# Patient Record
Sex: Female | Born: 1965
Health system: Southern US, Community
[De-identification: ages and names within clinical notes are randomized; demographics above are authoritative.]

## PROBLEM LIST (undated history)

## (undated) DIAGNOSIS — R011 Cardiac murmur, unspecified: Secondary | ICD-10-CM

## (undated) DIAGNOSIS — M1990 Unspecified osteoarthritis, unspecified site: Secondary | ICD-10-CM

## (undated) DIAGNOSIS — F419 Anxiety disorder, unspecified: Secondary | ICD-10-CM

## (undated) DIAGNOSIS — G2581 Restless legs syndrome: Secondary | ICD-10-CM

## (undated) DIAGNOSIS — D649 Anemia, unspecified: Secondary | ICD-10-CM

## (undated) DIAGNOSIS — I10 Essential (primary) hypertension: Secondary | ICD-10-CM

## (undated) DIAGNOSIS — J45909 Unspecified asthma, uncomplicated: Secondary | ICD-10-CM

## (undated) HISTORY — DX: Restless legs syndrome: G25.81

## (undated) HISTORY — DX: Anemia, unspecified: D64.9

## (undated) HISTORY — DX: Anxiety disorder, unspecified: F41.9

## (undated) HISTORY — PX: KNEE ARTHROSCOPY W/ ACL RECONSTRUCTION: SHX1858

## (undated) HISTORY — PX: TONSILLECTOMY: SUR1361

## (undated) HISTORY — DX: Unspecified asthma, uncomplicated: J45.909

## (undated) HISTORY — DX: Unspecified osteoarthritis, unspecified site: M19.90

---

## 1997-01-16 HISTORY — PX: LEEP: SHX91

## 2015-12-14 ENCOUNTER — Other Ambulatory Visit: Payer: Self-pay | Admitting: Obstetrics and Gynecology

## 2015-12-14 ENCOUNTER — Encounter (HOSPITAL_COMMUNITY): Payer: Self-pay | Admitting: *Deleted

## 2015-12-14 DIAGNOSIS — Z1231 Encounter for screening mammogram for malignant neoplasm of breast: Secondary | ICD-10-CM

## 2015-12-16 ENCOUNTER — Ambulatory Visit
Admission: RE | Admit: 2015-12-16 | Discharge: 2015-12-16 | Disposition: A | Discharge status: Home / Self Care | Payer: No Typology Code available for payment source | Source: Ambulatory Visit | Attending: Obstetrics and Gynecology | Admitting: Obstetrics and Gynecology

## 2015-12-16 ENCOUNTER — Ambulatory Visit (HOSPITAL_COMMUNITY)
Admission: RE | Admit: 2015-12-16 | Discharge: 2015-12-16 | Disposition: A | Discharge status: Home / Self Care | Payer: Self-pay | Source: Ambulatory Visit | Attending: Obstetrics and Gynecology | Admitting: Obstetrics and Gynecology

## 2015-12-16 ENCOUNTER — Encounter (HOSPITAL_COMMUNITY): Payer: Self-pay | Admitting: *Deleted

## 2015-12-16 VITALS — BP 104/68 | Temp 98.2°F | Ht 64.0 in | Wt 125.4 lb

## 2015-12-16 DIAGNOSIS — R87613 High grade squamous intraepithelial lesion on cytologic smear of cervix (HGSIL): Secondary | ICD-10-CM

## 2015-12-16 DIAGNOSIS — Z1231 Encounter for screening mammogram for malignant neoplasm of breast: Secondary | ICD-10-CM

## 2015-12-16 DIAGNOSIS — Z1239 Encounter for other screening for malignant neoplasm of breast: Secondary | ICD-10-CM

## 2015-12-16 HISTORY — DX: Cardiac murmur, unspecified: R01.1

## 2015-12-16 HISTORY — DX: Essential (primary) hypertension: I10

## 2015-12-16 NOTE — Patient Instructions (Signed)
Explained breast self awareness to [name]. Patient did not need a Pap smear today due to last Pap smear and HPV typing was completed 12/03/2015. Explained the colposcopy to patient the recommended follow up for her abnormal Pap smear. Referred patient to the [facility] for a colposcopy to follow up for abnormal Pap smear. Appointment scheduled for Friday, December 24, 2015 at 0840. [facility]. Appointment scheduled for Thursday, December 16, 2015 at 1120.  Patient aware of appointments and will be there. Let patient know the Breast Center will follow up with her within the next couple weeks with results of mammogram by letter or phone. Lindsay Butler verbalized understanding.  [place], Kathaleen Maserhristine Poll, RN 12:57 PM

## 2015-12-16 NOTE — Progress Notes (Signed)
[facility] due to having an abnormal Pap smear that a colposcopy is recommended for follow up.  Pap Smear: Pap smear not completed today. [facility] and HGSIL with positive HPV. Referred patient to the [facility] for a colposcopy to follow up for abnormal Pap smear. Appointment scheduled for Friday, December 24, 2015 at 0840. Per patient has a history of one other abnormal Pap smear that a LEEP was completed for follow up. Last Pap smear result will be scanned into EPIC prior to follow up appointment.  Physical exam: Breasts Breasts symmetrical. No skin abnormalities bilateral breasts. No nipple retraction bilateral breasts. No nipple discharge bilateral breasts. No lymphadenopathy. No lumps palpated bilateral breasts. No complaints of pain or tenderness on exam. [facility] mamm[facility]. Appointment scheduled for Thursday, December 16, 2015 at 1120.        Pelvic/Bimanual No Pap smear completed today since last Pap smear was 12/03/2015. Pap smear not indicated per BCCCP guidelines.   Smoking History: Patient has never smoked.  Patient Navigation: Patient education provided. Access to services provided for patient through BCCCP program.   Colorectal Cancer Screening: Per patient has never had a colonoscopy completed. No complaints today.

## 2015-12-17 ENCOUNTER — Encounter (HOSPITAL_COMMUNITY): Payer: Self-pay | Admitting: *Deleted

## 2015-12-24 ENCOUNTER — Encounter: Payer: Self-pay | Admitting: Obstetrics & Gynecology

## 2015-12-24 ENCOUNTER — Other Ambulatory Visit (HOSPITAL_COMMUNITY)
Admission: RE | Admit: 2015-12-24 | Discharge: 2015-12-24 | Disposition: A | Discharge status: Home / Self Care | Payer: No Typology Code available for payment source | Source: Ambulatory Visit | Attending: Obstetrics & Gynecology | Admitting: Obstetrics & Gynecology

## 2015-12-24 ENCOUNTER — Ambulatory Visit (INDEPENDENT_AMBULATORY_CARE_PROVIDER_SITE_OTHER): Payer: Self-pay | Admitting: Obstetrics & Gynecology

## 2015-12-24 VITALS — BP 126/78 | HR 66 | Wt 127.8 lb

## 2015-12-24 DIAGNOSIS — R87613 High grade squamous intraepithelial lesion on cytologic smear of cervix (HGSIL): Secondary | ICD-10-CM

## 2015-12-24 DIAGNOSIS — Z3202 Encounter for pregnancy test, result negative: Secondary | ICD-10-CM

## 2015-12-24 LAB — POCT PREGNANCY, URINE: PREG TEST UR: NEGATIVE

## 2015-12-24 NOTE — Patient Instructions (Addendum)
Thank you for enrolling in MyChart. Please follow the instructions below to securely access your online medical record. MyChart allows you to send messages to your doctor, view your test results, manage appointments, and more.   How Do I Sign Up? 1. In your Internet browser, go to [employer] Address [employer] and enter [email or web address]. 2. Click on the Sign Up Now link in the Sign In box. You will see the New Member Sign Up page. 3. Enter your MyChart Access Code exactly as it appears below. You will not need to use this code after you've completed the sign-up process. If you do not sign up before the expiration date, you must request a new code.  MyChart Access Code:  Sent on text  4. Enter your Social Security Number ([id number]) and Date of Birth (mm/dd/yyyy) as indicated and click Submit. You will be taken to the next sign-up page. 5. Create a MyChart ID. This will be your MyChart login ID and cannot be changed, so think of one that is secure and easy to remember. 6. Create a MyChart password. You can change your password at any time. 7. Enter your Password Reset Question and Answer. This can be used at a later time if you forget your password.  8. Enter your e-mail address. You will receive e-mail notification when new information is available in MyChart. 9. Click Sign Up. You can now view your medical record.   Additional Information Remember, MyChart is NOT to be used for urgent needs. For medical emergencies, dial 911.     Colposcopy, Care After This sheet gives you information about how to care for yourself after your procedure. Your health care provider may also give you more specific instructions. If you have problems or questions, contact your health care provider. What can I expect after the procedure? If you had a colposcopy without a biopsy, you can expect to feel fine right away, but you may have some spotting for a few days. You can go back to your normal activities. If  you had a colposcopy with a biopsy, it is common to have:  Soreness and pain. This may last for a few days.  Light-headedness.  Mild vaginal bleeding or dark-colored, grainy discharge. This may last for a few days. The discharge may be due to a solution that was used during the procedure. You may need to wear a sanitary pad during this time.  Spotting for at least 48 hours after the procedure. Follow these instructions at home:  Take over-the-counter and prescription medicines only as told by your health care provider. Talk with your health care provider about what type of over-the-counter pain medicine and prescription medicine you can start taking again. It is especially important to talk with your health care provider if you take blood-thinning medicine.  Do not drive or use heavy machinery while taking prescription pain medicine.  For at least 3 days after your procedure, or as long as told by your health care provider, avoid:  Douching.  Using tampons.  Having sexual intercourse.  Continue to use birth control (contraception).  Limit your physical activity for the first day after the procedure as told by your health care provider. Ask your health care provider what activities are safe for you.  It is up to you to get the results of your procedure. Ask your health care provider, or the department performing the procedure, when your results will be ready.  Keep all follow-up visits as told by your health care provider.  This is important. Contact a health care provider if:  You develop a skin rash. Get help right away if:  You are bleeding heavily from your vagina or you are passing blood clots. This includes using more than one sanitary pad per hour for 2 hours in a row.  You have a fever or chills.  You have pelvic pain.  You have abnormal, yellow-colored, or bad-smelling vaginal discharge. This could be a sign of infection.  You have severe pain or cramps in your lower  abdomen that do not get better with medicine.  You feel light-headed or dizzy, or you faint. Summary  If you had a colposcopy without a biopsy, you can expect to feel fine right away, but you may have some spotting for a few days. You can go back to your normal activities.  If you had a colposcopy with a biopsy, you may notice mild pain and spotting for 48 hours after the procedure.  Avoid douching, using tampons, and having sexual intercourse for 3 days after the procedure or as long as told by your health care provider.  Contact your health care provider if you have bleeding, severe pain, or signs of infection. This information is not intended to replace advice given to you by your health care provider. Make sure you discuss any questions you have with your health care provider. Document Released: 10/23/2012 Document Revised: 08/20/2015 Document Reviewed: 08/20/2015 Elsevier Interactive Patient Education  2017 [employer]

## 2015-12-24 NOTE — Progress Notes (Signed)
    GYNECOLOGY CLINIC COLPOSCOPY PROCEDURE NOTE  50 y.o. G1P0010 here for colposcopy for high-grade squamous intraepithelial neoplasia  (HGSIL-encompassing moderate and severe dysplasia) pap smear on 12/03/15.Per patient has a history of one other abnormal Pap smear that a LEEP was completed for follow up.   Discussed role for HPV in cervical dysplasia, need for surveillance.  Patient given informed consent, signed copy in the chart, time out was performed.  Placed in lithotomy position. Cervix viewed with speculum and colposcope after application of acetic acid.   Colposcopy adequate? No. Pinpoint cervical os noted s/p LEEP with moderate stenosis. Only able to get tip of curette and brush for specimen.  Only one punctate lesion noted at 9 o'clock, biopsy obtained.  ECC specimen obtained. All specimens were labeled and sent to pathology.   Patient was given post procedure instructions.  Will follow up pathology and manage accordingly.  Routine preventative health maintenance measures emphasized.    Jaynie CollinsUGONNA  Choya Tornow, MD, FACOG Attending Obstetrician & Gynecologist, [facility], [facility]

## 2015-12-27 ENCOUNTER — Encounter: Payer: Self-pay | Admitting: Obstetrics & Gynecology

## 2015-12-28 ENCOUNTER — Telehealth (HOSPITAL_COMMUNITY): Payer: Self-pay | Admitting: *Deleted

## 2015-12-28 NOTE — Telephone Encounter (Signed)
Patient called me today after receiving results to colposcopy. Patient's colposcopy showed benign results but due to Pap smear being HGSIL the physician that completed the colposcopy is recommending a LEEP. Told patient I would check with the State to determine if she is [facility] and call her back. Patient verbalized understanding.

## 2015-12-28 NOTE — Telephone Encounter (Signed)
Called patient after speaking with the State. Let her know that the [facility]. Explained to patient that I have given the approval information and CPT codes to Dr. Macon LargeAnyanwu. Told patient that if her LEEP shows CIN II or greater then she will be eligible for BCCCP Medicaid. Informed patient that the pregnancy test is not [facility] but would be by Medicaid. Patient verbalized understanding.

## 2015-12-29 ENCOUNTER — Encounter: Payer: Self-pay | Admitting: General Practice

## 2015-12-29 ENCOUNTER — Telehealth: Payer: Self-pay | Admitting: General Practice

## 2015-12-29 NOTE — Telephone Encounter (Signed)
Per Dr Macon LargeAnyanwu, patient needs LEEP due to inadequate pathology from colposcopy. Called patient, no answer- left message stating we are trying to reach you with results and about an appt. I will also send you a mychart message, so check there for information. mychart message sent and front office notified of need for appt.

## 2016-01-12 ENCOUNTER — Encounter: Payer: Self-pay | Admitting: General Practice

## 2016-01-13 ENCOUNTER — Telehealth: Payer: Self-pay | Admitting: General Practice

## 2016-02-02 ENCOUNTER — Encounter: Payer: Self-pay | Admitting: Obstetrics & Gynecology

## 2016-02-16 ENCOUNTER — Ambulatory Visit (INDEPENDENT_AMBULATORY_CARE_PROVIDER_SITE_OTHER): Payer: Self-pay | Admitting: Obstetrics & Gynecology

## 2016-02-16 ENCOUNTER — Other Ambulatory Visit (HOSPITAL_COMMUNITY)
Admission: RE | Admit: 2016-02-16 | Discharge: 2016-02-16 | Disposition: A | Discharge status: Home / Self Care | Payer: Self-pay | Source: Ambulatory Visit | Attending: Obstetrics & Gynecology | Admitting: Obstetrics & Gynecology

## 2016-02-16 ENCOUNTER — Encounter: Payer: Self-pay | Admitting: Obstetrics & Gynecology

## 2016-02-16 VITALS — BP 127/79 | HR 51 | Ht 64.0 in | Wt 126.8 lb

## 2016-02-16 DIAGNOSIS — R87613 High grade squamous intraepithelial lesion on cytologic smear of cervix (HGSIL): Secondary | ICD-10-CM | POA: Insufficient documentation

## 2016-02-16 DIAGNOSIS — Z3202 Encounter for pregnancy test, result negative: Secondary | ICD-10-CM

## 2016-02-16 LAB — POCT PREGNANCY, URINE: PREG TEST UR: NEGATIVE

## 2016-02-16 NOTE — Patient Instructions (Signed)
Conization of the Cervix, Care After Refer to this sheet in the next few weeks. These instructions provide you with information on caring for yourself after your procedure. Your health care provider may also give you more specific instructions. Your treatment has been planned according to current medical practices but problems sometimes occur. Call your health care provider if you have any problems or questions after your procedure. WHAT TO EXPECT AFTER THE PROCEDURE After your procedure, it is typical to have the following sensations:  If you had a general anesthetic, you may be groggy for 2-3 hours after the procedure.  You may have cramps (similar to menstrual cramps) for about 1 week.   You may have a bloody discharge or light to moderate bleeding for 1-2 weeks. The bleeding should not be heavy (for example, it should not soak 1 pad in less than 1 hour).  You may have a black vaginal discharge that looks similar to coffee grounds. This is from the paste that was applied to the cervix to control bleeding. This is normal. Recovery may take up to 3 weeks.  HOME CARE INSTRUCTIONS   Arrange for someone to drive you home after the procedure.  Only take medicines as directed by your health care provider. Do not take aspirin. It can cause bleeding.   Take showers for the first week. Do not take baths, swim, or use hot tubs until your health care provider says it is okay.   Do not douche, use tampons, or have sexual intercourse until your health care provider says it is okay.   Avoid strenuous activities, exercises, and heavy lifting for at least 7-14 days.  You may resume your normal diet unless your health care provider advises you differently.    If you are constipated, you may:  Take a mild laxative as directed by your health care provider.   Add fruit and bran to your diet.   Make sure to drink enough fluids to keep your urine clear or pale yellow.  Keep follow-up  appointments with your health care provider. SEEK MEDICAL CARE IF:   You develop a rash.   You are dizzy or lightheaded.   You feel nauseous.   You develop a bad smelling vaginal discharge. SEEK IMMEDIATE MEDICAL CARE IF:   You have blood clots or bleeding that is heavier than a normal menstrual period (for example, soaking a pad in less than 1 hour) or you develop bright red bleeding.   You have a fever over 101F (38.3C) or persistent symptoms for more than 2-3 days.   You have a fever over 101F (38.3C) and your symptoms suddenly get worse.  You have increasing cramps.   You faint.   You have pain when urinating.  You have bloody urine.   You start vomiting.   Your pain is not relieved with your medicine.   Your have severe or worsening pain. MAKE SURE YOU:  Understand these instructions.  Will watch your condition.  Will get help right away if you are not doing well or get worse. This information is not intended to replace advice given to you by your health care provider. Make sure you discuss any questions you have with your health care provider. Document Released: 01/02/2005 Document Revised: 01/07/2013 Document Reviewed: 06/28/2012 Elsevier Interactive Patient Education  2017 Elsevier Inc.  

## 2016-02-16 NOTE — Progress Notes (Signed)
   GYNECOLOGY OFFICE PROCEDURE NOTE  Lindsay Butler is a 51 y.o. G1P0010 here for LEEP. No GYN concerns. Pap smear and colposcopy reviewed.    Pap HGSIL 12/24/15 Colposcopy Pathology 1. Cervix, biopsy, 9 o'clock - BENIGN SQUAMOUS MUCOSA. - NO ENDOCERVICAL OR TRANSFORMATION ZONE PRESENT. - NO SQUAMOUS INTRAEPITHELIAL LESION 2. Endocervix, curettage - BENIGN SUPERFICIAL SQUAMOUS FRAGMENTS. - NO SQUAMOUS INTRAEPITHELIAL LESION. - NO ENDOCERVICAL TISSUE PRESENT.  Risks, benefits, alternatives, and limitations of procedure explained to patient, including pain, bleeding, infection, failure to remove abnormal tissue and failure to cure dysplasia, need for repeat procedures, damage to pelvic organs, cervical incompetence.  Role of HPV,cervical dysplasia and need for close followup was empasized. Informed written consent was obtained. All questions were answered. Time out performed. Urine pregnancy test was negative.  Procedure: The patient was placed in lithotomy position and the bivalved coated speculum was placed in the patient's vagina. A grounding pad placed on the patient. Lugol's solution was applied to the cervix and no area of decreased uptake was noted around the transformation zone.   Local anesthesia was administered via an intracervical block using 8 cc of 2% Lidocaine with epinephrine. The suction was turned on and the Small 1X Fisher [employer]one Biopsy Excisor on 7450 Watts of cutting current was used to excise the entire transformation zone. Excellent hemostasis was achieved using roller ball coagulation set at 50 Watts coagulation current. Monsel's solution was then applied and the speculum was removed from the vagina. Specimens were sent to pathology.  The patient tolerated the procedure well. Post-operative instructions given to patient, including instruction to seek medical attention for persistent bright red bleeding, fever, abdominal/pelvic pain, dysuria, nausea or vomiting. She was also told  about the possibility of having copious yellow to black tinged discharge for weeks. She was counseled to avoid anything in the vagina (sex/douching/tampons) for 3 weeks. She has a 4 week post-operative check to assess wound healing, review results and discuss further management.     Jaynie CollinsUGONNA  [name], MD, FACOG Attending Obstetrician & Gynecologist, [facility]

## 2016-02-21 ENCOUNTER — Encounter: Payer: Self-pay | Admitting: General Practice

## 2016-03-16 ENCOUNTER — Encounter: Payer: Self-pay | Admitting: Obstetrics & Gynecology

## 2016-03-16 ENCOUNTER — Ambulatory Visit (INDEPENDENT_AMBULATORY_CARE_PROVIDER_SITE_OTHER): Payer: Self-pay | Admitting: Obstetrics & Gynecology

## 2016-03-16 VITALS — BP 113/70 | HR 82 | Ht 64.0 in | Wt 126.9 lb

## 2016-03-16 DIAGNOSIS — Z9889 Other specified postprocedural states: Secondary | ICD-10-CM

## 2016-03-16 DIAGNOSIS — N870 Mild cervical dysplasia: Secondary | ICD-10-CM

## 2016-03-16 NOTE — Progress Notes (Signed)
  Subjective:     Lindsay Butler is a 51 y.o. 421P0010 female who presents to the clinic 4 weeks status post LEEP for HGSIL. Eating a regular diet without difficulty. Bowel movements are normal. The patient is not having any pain.  Has resumed intercourse, had some bleeding afterwards.  The following portions of the patient's history were reviewed and updated as appropriate: allergies, current medications, past family history, past medical history, past social history, past surgical history and problem list.  Review of Systems Pertinent items are noted in HPI.    Objective:    BP 113/70   Pulse 82   Ht 5\' 4"  (1.626 m)   Wt 126 lb 14.4 oz (57.6 kg)   LMP 03/01/2016 (Exact Date)   BMI 21.78 kg/m  General:  alert and no distress  Abdomen: soft, bowel sounds active, non-tender  Cervix:   healing well, no drainage, no erythema. Some friable area seen, treated with silver nitrate.         02/16/2016 Cervix, LEEP  pathology - LOW GRADE SQUAMOUS INTRAEPITHELIAL LESION, CIN-I (MILD DYSPLASIA). - NO HIGH GRADE SQUAMOUS INTRAEPITHELIAL LESION IDENTIFIED. - MARGINS NOT INVOLVED.   Assessment:    Doing well postoperatively. Operative findings again reviewed. Pathology report discussed.    Plan:   Repeat pap and HPV test in 12 and 24 months.  Jaynie CollinsUGONNA  Juventino Pavone, MD, FACOG Attending Obstetrician & Gynecologist, [facility], [facility]

## 2016-03-16 NOTE — Patient Instructions (Signed)
Return to clinic for any scheduled appointments or for any gynecologic concerns as needed.   

## 2016-12-18 ENCOUNTER — Telehealth (HOSPITAL_COMMUNITY): Payer: Self-pay

## 2016-12-18 NOTE — Telephone Encounter (Signed)
Called patient to renew BCCCP. She states she now has a job that offers benefits and no longer needs to enroll with BCCCP.

## 2017-08-27 ENCOUNTER — Ambulatory Visit (INDEPENDENT_AMBULATORY_CARE_PROVIDER_SITE_OTHER): Payer: Worker's Compensation

## 2017-08-27 ENCOUNTER — Other Ambulatory Visit: Payer: Self-pay

## 2017-08-27 ENCOUNTER — Ambulatory Visit (INDEPENDENT_AMBULATORY_CARE_PROVIDER_SITE_OTHER): Payer: Worker's Compensation | Admitting: Physician Assistant

## 2017-08-27 ENCOUNTER — Encounter: Payer: Self-pay | Admitting: Physician Assistant

## 2017-08-27 VITALS — BP 122/70 | HR 83 | Temp 98.1°F | Resp 16 | Ht 64.76 in | Wt 135.8 lb

## 2017-08-27 DIAGNOSIS — S39092A Other injury of muscle, fascia and tendon of lower back, initial encounter: Secondary | ICD-10-CM | POA: Diagnosis not present

## 2017-08-27 DIAGNOSIS — R1031 Right lower quadrant pain: Secondary | ICD-10-CM

## 2017-08-27 DIAGNOSIS — T148XXA Other injury of unspecified body region, initial encounter: Secondary | ICD-10-CM

## 2017-08-27 DIAGNOSIS — W19XXXA Unspecified fall, initial encounter: Secondary | ICD-10-CM

## 2017-08-27 DIAGNOSIS — M542 Cervicalgia: Secondary | ICD-10-CM | POA: Diagnosis not present

## 2017-08-27 DIAGNOSIS — M7918 Myalgia, other site: Secondary | ICD-10-CM | POA: Diagnosis not present

## 2017-08-27 LAB — POCT URINE PREGNANCY: Preg Test, Ur: NEGATIVE

## 2017-08-27 MED ORDER — NAPROXEN 500 MG PO TABS: 500.0000 mg | ORAL_TABLET | Freq: Two times a day (BID) | ORAL | 0 refills | Status: DC

## 2017-08-27 NOTE — Patient Instructions (Addendum)
Take naproxen twice daily for pain. You may use half a tablet of flexeril during the day to avoid being too drowsy. For bruising, apply a warm heating pad to affected are 4-5 x a day for at least 20 minutes at a time. This will also feel good on your neck.  Start neck exercises as tolerated.  Follow up 09/03/17.    Hematoma A hematoma is a collection of blood. The collection of blood can turn into a hard, painful lump under the skin. Your skin may turn blue or yellow if the hematoma is close to the surface of the skin. Most hematomas get better in a few days to weeks. Some hematomas are serious and need medical care. Hematomas can be very small or very big. Follow these instructions at home:  Apply ice to the injured area: ? Put ice in a plastic bag. ? Place a towel between your skin and the bag. ? Leave the ice on for 20 minutes, 2-3 times a day for the first 1 to 2 days.  After the first 2 days, switch to using warm packs on the injured area.  Raise (elevate) the injured area to lessen pain and puffiness (swelling). You may also wrap the area with an elastic bandage. Make sure the bandage is not wrapped too tight.  If you have a painful hematoma on your leg or foot, you may use crutches for a couple days.  Only take medicines as told by your doctor. Get help right away if:  Your pain gets worse.  Your pain is not controlled with medicine.  You have a fever.  Your puffiness gets worse.  Your skin turns more blue or yellow.  Your skin over the hematoma breaks or starts bleeding.  Your hematoma is in your chest or belly (abdomen) and you are short of breath, feel weak, or have a change in consciousness.  Your hematoma is on your scalp and you have a headache that gets worse or a change in alertness or consciousness. This information is not intended to replace advice given to you by your health care provider. Make sure you discuss any questions you have with your health care  provider. Document Released: 02/10/2004 Document Revised: 06/10/2015 Document Reviewed: 06/12/2012 Elsevier Interactive Patient Education  2017 [employer]   Cervical Strain and Sprain Rehab Ask your health care provider which exercises are safe for you. Do exercises exactly as told by your health care provider and adjust them as directed. It is normal to feel mild stretching, pulling, tightness, or discomfort as you do these exercises, but you should stop right away if you feel sudden pain or your pain gets worse.Do not begin these exercises until told by your health care provider. Stretching and range of motion exercises These exercises warm up your muscles and joints and improve the movement and flexibility of your neck. These exercises also help to relieve pain, numbness, and tingling. Exercise A: Cervical side bend  1. Using good posture, sit on a stable chair or stand up. 2. Without moving your shoulders, slowly tilt your left / right ear to your shoulder until you feel a stretch in your neck muscles. You should be looking straight ahead. 3. Hold for __________ seconds. 4. Repeat with the other side of your neck. Repeat __________ times. Complete this exercise __________ times a day. Exercise B: Cervical rotation  1. Using good posture, sit on a stable chair or stand up. 2. Slowly turn your head to the side as  if you are looking over your left / right shoulder. ? Keep your eyes level with the ground. ? Stop when you feel a stretch along the side and the back of your neck. 3. Hold for __________ seconds. 4. Repeat this by turning to your other side. Repeat __________ times. Complete this exercise __________ times a day. Exercise C: Thoracic extension and pectoral stretch 1. Roll a towel or a small blanket so it is about 4 inches (10 cm) in diameter. 2. Lie down on your back on a firm surface. 3. Put the towel lengthwise, under your spine in the middle of your back. It should not  be not under your shoulder blades. The towel should line up with your spine from your middle back to your lower back. 4. Put your hands behind your head and let your elbows fall out to your sides. 5. Hold for __________ seconds. Repeat __________ times. Complete this exercise __________ times a day. Strengthening exercises These exercises build strength and endurance in your neck. Endurance is the ability to use your muscles for a long time, even after your muscles get tired. Exercise D: Upper cervical flexion, isometric 1. Lie on your back with a thin pillow behind your head and a small rolled-up towel under your neck. 2. Gently tuck your chin toward your chest and nod your head down to look toward your feet. Do not lift your head off the pillow. 3. Hold for __________ seconds. 4. Release the tension slowly. Relax your neck muscles completely before you repeat this exercise. Repeat __________ times. Complete this exercise __________ times a day. Exercise E: Cervical extension, isometric  1. Stand about 6 inches (15 cm) away from a wall, with your back facing the wall. 2. Place a soft object, about 6-8 inches (15-20 cm) in diameter, between the back of your head and the wall. A soft object could be a small pillow, a ball, or a folded towel. 3. Gently tilt your head back and press into the soft object. Keep your jaw and forehead relaxed. 4. Hold for __________ seconds. 5. Release the tension slowly. Relax your neck muscles completely before you repeat this exercise. Repeat __________ times. Complete this exercise __________ times a day. Posture and body mechanics  Body mechanics refers to the movements and positions of your body while you do your daily activities. Posture is part of body mechanics. Good posture and healthy body mechanics can help to relieve stress in your body's tissues and joints. Good posture means that your spine is in its natural S-curve position (your spine is neutral),  your shoulders are pulled back slightly, and your head is not tipped forward. The following are general guidelines for applying improved posture and body mechanics to your everyday activities. Standing  When standing, keep your spine neutral and keep your feet about hip-width apart. Keep a slight bend in your knees. Your ears, shoulders, and hips should line up.  When you do a task in which you stand in one place for a long time, place one foot up on a stable object that is 2-4 inches (5-10 cm) high, such as a footstool. This helps keep your spine neutral. Sitting   When sitting, keep your spine neutral and your keep feet flat on the floor. Use a footrest, if necessary, and keep your thighs parallel to the floor. Avoid rounding your shoulders, and avoid tilting your head forward.  When working at a desk or a computer, keep your desk at a height where  your hands are slightly lower than your elbows. Slide your chair under your desk so you are close enough to maintain good posture.  When working at a computer, place your monitor at a height where you are looking straight ahead and you do not have to tilt your head forward or downward to look at the screen. Resting When lying down and resting, avoid positions that are most painful for you. Try to support your neck in a neutral position. You can use a contour pillow or a small rolled-up towel. Your pillow should support your neck but not push on it. This information is not intended to replace advice given to you by your health care provider. Make sure you discuss any questions you have with your health care provider. Document Released: 01/02/2005 Document Revised: 09/09/2015 Document Reviewed: 12/09/2014 Elsevier Interactive Patient Education  2018 [employer]  IF you received an x-ray today, you will receive an invoice from [facility]. Please contact [facility] at [phone] with questions or concerns regarding your invoice.    IF you received labwork today, you will receive an invoice from [facility]. Please contact [facility] at [phone] with questions or concerns regarding your invoice.   Our billing staff will not be able to assist you with questions regarding bills from these companies.  You will be contacted with the lab results as soon as they are available. The fastest way to get your results is to activate your My Chart account. Instructions are located on the last page of this paperwork. If you have not heard from us regarding the results in 2 weeks, please contact this office.

## 2017-08-27 NOTE — Progress Notes (Addendum)
MRN: [id number] DOB: 09/02/1965  Subjective:   Lindsay Butler is a 52 y.o. female presenting for chief complaint of Work Related Injury (pt states she had a fall at work on 08/21/2017 and now have a back and groin injury  )  Reports injury on 08/21/17. 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 When she landed the ladder struck her around the perineal area/buttocks. Denies loss consciousness or head injury.Seen in ED on 08/21/17. No plain films obtained. Tx for musculoskeletal pain. Today, notes her pain is notable in groin area. Has bruising, muscle tightness, and pain. Pain extends from "vagina to right leg." Also having low back pain and neck pain.  Denies numbness, tingling, saddle anesthesia, bladder/bowel incontinence, and limb weakness. Has not been back to work at this point yet. Has been taking medication as prescribed.  Note the medication just makes her sleepy and aleve helps her.   Heavin's medications list, allergies, past medical history and past surgical history were reviewed and excluded from this note due to being a worker's comp case.   Objective:   Vitals: BP 122/70   Pulse 83   Temp 98.1 F (36.7 C) (Oral)   Resp 16   Ht 5' 4.76" (1.645 m)   Wt 135 lb 12.8 oz (61.6 kg)   SpO2 99%   BMI 22.76 kg/m   Physical Exam  Constitutional: She is oriented to person, place, and time. She appears well-developed and well-nourished.  Sitting on a donut-shaped pillow.   HENT:  Head: Normocephalic and atraumatic.  Eyes: Pupils are equal, round, and reactive to light. Conjunctivae and EOM are normal.  Neck: Normal range of motion and full passive range of motion without pain. Neck supple. Muscular tenderness (with palpation of paraspinal and trapezius musculature b/l ) present. No spinous process tenderness present.  Pulmonary/Chest: Effort normal.  Genitourinary: Rectum normal and vagina normal. Rectal exam shows no fissure, no mass,  no tenderness and anal tone normal.    No erythema, tenderness or bleeding in the vagina. No foreign body in the vagina. No signs of injury around the vagina.  Genitourinary Comments: CMA chaperone present for GU exam.  Musculoskeletal:       Right hip: She exhibits normal range of motion, normal strength, no tenderness and no bony tenderness.       Left hip: Normal. She exhibits normal range of motion, normal strength, no tenderness and no bony tenderness.       Thoracic back: Normal. She exhibits normal range of motion, no tenderness and no bony tenderness.       Lumbar back: Normal. She exhibits normal range of motion, no tenderness and no bony tenderness.       Back:  Neurological: She is alert and oriented to person, place, and time. She has normal strength and normal reflexes. No cranial nerve deficit or sensory deficit.  Skin: Skin is warm and dry.  Psychiatric: She has a normal mood and affect.  Vitals reviewed.   No results found for this or any previous visit (from the past 24 hour(s)).   No results found.  Assessment and Plan :  1. Neck pain - DG Cervical Spine Complete; Future - naproxen (NAPROSYN) 500 MG tablet; Take 1 tablet (500 mg total) by mouth 2 (two) times daily with a meal.  Dispense: 30 tablet; Refill: 0  2. Right inguinal pain - DG Sacrum/Coccyx; Future - DG Pelvis 1-2 Views; Future -  POCT urine pregnancy  3. Hematoma  4. Musculoskeletal pain - naproxen (NAPROSYN) 500 MG tablet; Take 1 tablet (500 mg total) by mouth 2 (two) times daily with a meal.  Dispense: 30 tablet; Refill: 0  5. Fall, initial encounter Plain films reassuring for no acute fx or dislocation. Pt did sustain a large ecchymosis and moderate sized hematoma in the right perineum.  Overlying skin is warm to palpation. No concern for compartment syndrome at this time. There is mild TTP, not out of proportion to exam. No pallor, pulselessness, paresthesias, or poikilothermia.  Heating pad to  the affected area 4-5 times a day for 20 minutes at a time. Rec using naproxen as prescribed for pain. Continue with flexeril as needed. Rec rest. Given work note with restrictions.  Follow up in one week for reevaluation.   Benjiman CoreBrittany Jahshua Bonito, PA-C  [facility] at [facility] 09/04/2017 10:23 AM

## 2017-09-03 ENCOUNTER — Encounter: Payer: Self-pay | Admitting: Physician Assistant

## 2017-09-04 ENCOUNTER — Other Ambulatory Visit: Payer: Self-pay

## 2017-09-04 ENCOUNTER — Ambulatory Visit (INDEPENDENT_AMBULATORY_CARE_PROVIDER_SITE_OTHER): Payer: Worker's Compensation | Admitting: Physician Assistant

## 2017-09-04 ENCOUNTER — Encounter: Payer: Self-pay | Admitting: Physician Assistant

## 2017-09-04 ENCOUNTER — Ambulatory Visit: Payer: Self-pay | Admitting: Physician Assistant

## 2017-09-04 VITALS — BP 134/81 | HR 72 | Temp 99.2°F | Resp 18 | Ht 64.69 in | Wt 137.2 lb

## 2017-09-04 DIAGNOSIS — S39092D Other injury of muscle, fascia and tendon of lower back, subsequent encounter: Secondary | ICD-10-CM | POA: Diagnosis not present

## 2017-09-04 DIAGNOSIS — R1031 Right lower quadrant pain: Secondary | ICD-10-CM

## 2017-09-04 DIAGNOSIS — M7918 Myalgia, other site: Secondary | ICD-10-CM | POA: Diagnosis not present

## 2017-09-04 DIAGNOSIS — M542 Cervicalgia: Secondary | ICD-10-CM | POA: Diagnosis not present

## 2017-09-04 DIAGNOSIS — W19XXXD Unspecified fall, subsequent encounter: Secondary | ICD-10-CM

## 2017-09-04 DIAGNOSIS — T148XXA Other injury of unspecified body region, initial encounter: Secondary | ICD-10-CM

## 2017-09-04 NOTE — Progress Notes (Signed)
    MRN: [id number] DOB: 10/06/1965  Subjective:   Ellen Henriracy Fraticelli is a 52 y.o. female presenting for chief complaint of Work Related Injury (f/u) Last seen on 08/27/2017.  Plain film of cervical spine, pelvis, sacrum/coccyx negative.  Treated for musculoskeletal injury with naproxen.  Recommend continue Flexeril as prescribed.  Today, she reports doing much better.  No longer having to sit on a donut pillow.  Skin bruising in the pelvic region has significantly improved.  Still has mild hematoma noted.  Only tender if she applies lots of pressure.  Has neck pain if she has her neck in flexed position looking up or down for long period of time.  Otherwise, significantly improved.  She has been trying to do work around her house that mimics work at her job over the past few days while on vacation and that she has been able to do it without any issues.  Believe she go back to work with no restrictions at this time. Denies numbness, tingling, saddle anesthesia, bladder/bowel incontinence, and limb weakness.  Has only applied heating pad to the groin region a few times and has only done neck strengthening exercises a few times.  Notes both helped.   Desaree's medications list, allergies, past medical history and past surgical history were reviewed and excluded from this note due to being a worker's comp case.   Objective:   Vitals: BP 134/81   Pulse 72   Temp 99.2 F (37.3 C) (Oral)   Resp 18   Ht 5' 4.69" (1.643 m)   Wt 137 lb 3.2 oz (62.2 kg)   SpO2 98%   BMI 23.05 kg/m   Physical Exam  Constitutional: She is oriented to person, place, and time. She appears well-developed and well-nourished.  HENT:  Head: Normocephalic and atraumatic.  Eyes: Conjunctivae are normal.  Neck: Normal range of motion and full passive range of motion without pain. Neck supple. No spinous process tenderness and no muscular tenderness present. No neck rigidity. No edema, no erythema and normal range of motion present.    Pulmonary/Chest: Effort normal.  Genitourinary:     Musculoskeletal:       Lumbar back: Normal. She exhibits no tenderness and no bony tenderness.  Neurological: She is alert and oriented to person, place, and time.  Skin: Skin is warm and dry.  Psychiatric: She has a normal mood and affect.  Vitals reviewed.  CMA chaperone present for GU exam. No results found for this or any previous visit (from the past 24 hour(s)).  Assessment and Plan :  1. Neck pain Improved.  Recommended continue with neck strengthening exercises as tolerated. 2. Right inguinal pain 3. Hematoma Ecchymosis has resolved.  Still has remaining hematoma where she took the blunt of the fall.  It has decreased in size since last visit.  Mildly tender to palpation.  She is only used warm compresses to affected area few times and notes it did help. Encouraged to continue to do so 2-3 x daily.   Encouraged to return to office if she stops seeing improvement or no full improvement in 6-8 weeks.  4. Musculoskeletal pain 5. Fall, subsequent encounter May return to work with no restrictions at this time.   Benjiman CoreBrittany Wiseman, PA-C  [facility] at [facility] 09/04/2017 10:15 AM

## 2017-09-04 NOTE — Patient Instructions (Addendum)
It was a pleasure seeing you again today.  I recommend applying warm heat to your hematoma at least 2-3 times a day for 20 minutes at a time.  This should continue to get smaller.  If you stop seeing improvement or do not have full improvement over the next 6 to 8 weeks, please return to office for further evaluation.  For neck pain, continue doing neck exercises daily.  Follow-up as needed. Hematoma A hematoma is a collection of blood. The collection of blood can turn into a hard, painful lump under the skin. Your skin may turn blue or yellow if the hematoma is close to the surface of the skin. Most hematomas get better in a few days to weeks. Some hematomas are serious and need medical care. Hematomas can be very small or very big. Follow these instructions at home:  Apply ice to the injured area: ? Put ice in a plastic bag. ? Place a towel between your skin and the bag. ? Leave the ice on for 20 minutes, 2-3 times a day for the first 1 to 2 days.  After the first 2 days, switch to using warm packs on the injured area.  Raise (elevate) the injured area to lessen pain and puffiness (swelling). You may also wrap the area with an elastic bandage. Make sure the bandage is not wrapped too tight.  If you have a painful hematoma on your leg or foot, you may use crutches for a couple days.  Only take medicines as told by your doctor. Get help right away if:  Your pain gets worse.  Your pain is not controlled with medicine.  You have a fever.  Your puffiness gets worse.  Your skin turns more blue or yellow.  Your skin over the hematoma breaks or starts bleeding.  Your hematoma is in your chest or belly (abdomen) and you are short of breath, feel weak, or have a change in consciousness.  Your hematoma is on your scalp and you have a headache that gets worse or a change in alertness or consciousness. This information is not intended to replace advice given to you by your health care  provider. Make sure you discuss any questions you have with your health care provider. Document Released: 02/10/2004 Document Revised: 06/10/2015 Document Reviewed: 06/12/2012 Elsevier Interactive Patient Education  2017 [employer]vier Inc.   Cervical Strain and Sprain Rehab Ask your health care provider which exercises are safe for you. Do exercises exactly as told by your health care provider and adjust them as directed. It is normal to feel mild stretching, pulling, tightness, or discomfort as you do these exercises, but you should stop right away if you feel sudden pain or your pain gets worse.Do not begin these exercises until told by your health care provider. Stretching and range of motion exercises These exercises warm up your muscles and joints and improve the movement and flexibility of your neck. These exercises also help to relieve pain, numbness, and tingling. Exercise A: Cervical side bend  1. Using good posture, sit on a stable chair or stand up. 2. Without moving your shoulders, slowly tilt your left / right ear to your shoulder until you feel a stretch in your neck muscles. You should be looking straight ahead. 3. Hold for __________ seconds. 4. Repeat with the other side of your neck. Repeat __________ times. Complete this exercise __________ times a day. Exercise B: Cervical rotation  1. Using good posture, sit on a stable chair or  stand up. 2. Slowly turn your head to the side as if you are looking over your left / right shoulder. ? Keep your eyes level with the ground. ? Stop when you feel a stretch along the side and the back of your neck. 3. Hold for __________ seconds. 4. Repeat this by turning to your other side. Repeat __________ times. Complete this exercise __________ times a day. Exercise C: Thoracic extension and pectoral stretch 1. Roll a towel or a small blanket so it is about 4 inches (10 cm) in diameter. 2. Lie down on your back on a firm surface. 3. Put the  towel lengthwise, under your spine in the middle of your back. It should not be not under your shoulder blades. The towel should line up with your spine from your middle back to your lower back. 4. Put your hands behind your head and let your elbows fall out to your sides. 5. Hold for __________ seconds. Repeat __________ times. Complete this exercise __________ times a day. Strengthening exercises These exercises build strength and endurance in your neck. Endurance is the ability to use your muscles for a long time, even after your muscles get tired. Exercise D: Upper cervical flexion, isometric 1. Lie on your back with a thin pillow behind your head and a small rolled-up towel under your neck. 2. Gently tuck your chin toward your chest and nod your head down to look toward your feet. Do not lift your head off the pillow. 3. Hold for __________ seconds. 4. Release the tension slowly. Relax your neck muscles completely before you repeat this exercise. Repeat __________ times. Complete this exercise __________ times a day. Exercise E: Cervical extension, isometric  1. Stand about 6 inches (15 cm) away from a wall, with your back facing the wall. 2. Place a soft object, about 6-8 inches (15-20 cm) in diameter, between the back of your head and the wall. A soft object could be a small pillow, a ball, or a folded towel. 3. Gently tilt your head back and press into the soft object. Keep your jaw and forehead relaxed. 4. Hold for __________ seconds. 5. Release the tension slowly. Relax your neck muscles completely before you repeat this exercise. Repeat __________ times. Complete this exercise __________ times a day. Posture and body mechanics  Body mechanics refers to the movements and positions of your body while you do your daily activities. Posture is part of body mechanics. Good posture and healthy body mechanics can help to relieve stress in your body's tissues and joints. Good posture means  that your spine is in its natural S-curve position (your spine is neutral), your shoulders are pulled back slightly, and your head is not tipped forward. The following are general guidelines for applying improved posture and body mechanics to your everyday activities. Standing  When standing, keep your spine neutral and keep your feet about hip-width apart. Keep a slight bend in your knees. Your ears, shoulders, and hips should line up.  When you do a task in which you stand in one place for a long time, place one foot up on a stable object that is 2-4 inches (5-10 cm) high, such as a footstool. This helps keep your spine neutral. Sitting   When sitting, keep your spine neutral and your keep feet flat on the floor. Use a footrest, if necessary, and keep your thighs parallel to the floor. Avoid rounding your shoulders, and avoid tilting your head forward.  When working at a  desk or a computer, keep your desk at a height where your hands are slightly lower than your elbows. Slide your chair under your desk so you are close enough to maintain good posture.  When working at a computer, place your monitor at a height where you are looking straight ahead and you do not have to tilt your head forward or downward to look at the screen. Resting When lying down and resting, avoid positions that are most painful for you. Try to support your neck in a neutral position. You can use a contour pillow or a small rolled-up towel. Your pillow should support your neck but not push on it. This information is not intended to replace advice given to you by your health care provider. Make sure you discuss any questions you have with your health care provider. Document Released: 01/02/2005 Document Revised: 09/09/2015 Document Reviewed: 12/09/2014 Elsevier Interactive Patient Education  2018 [employer]  IF you received an x-ray today, you will receive an invoice from [facility]. Please contact [facility] at [phone] with questions or concerns regarding your invoice.   IF you received labwork today, you will receive an invoice from [facility]. Please contact [facility] at [phone] with questions or concerns regarding your invoice.   Our billing staff will not be able to assist you with questions regarding bills from these companies.  You will be contacted with the lab results as soon as they are available. The fastest way to get your results is to activate your My Chart account. Instructions are located on the last page of this paperwork. If you have not heard from us regarding the results in 2 weeks, please contact this office.

## 2017-12-11 ENCOUNTER — Telehealth: Payer: Self-pay | Admitting: Family Medicine

## 2017-12-11 NOTE — Telephone Encounter (Signed)
Called pt to get her scheduled for her pap smear and pt stated that that she has new insurance now and will be going to another another office since her appt is in January.

## 2017-12-24 DIAGNOSIS — R350 Frequency of micturition: Secondary | ICD-10-CM | POA: Diagnosis not present

## 2017-12-24 DIAGNOSIS — N898 Other specified noninflammatory disorders of vagina: Secondary | ICD-10-CM | POA: Diagnosis not present

## 2018-01-29 DIAGNOSIS — R140 Abdominal distension (gaseous): Secondary | ICD-10-CM | POA: Diagnosis not present

## 2018-01-29 DIAGNOSIS — Z01411 Encounter for gynecological examination (general) (routine) with abnormal findings: Secondary | ICD-10-CM | POA: Diagnosis not present

## 2018-01-29 DIAGNOSIS — K580 Irritable bowel syndrome with diarrhea: Secondary | ICD-10-CM | POA: Diagnosis not present

## 2018-01-29 DIAGNOSIS — N898 Other specified noninflammatory disorders of vagina: Secondary | ICD-10-CM | POA: Diagnosis not present

## 2018-01-31 DIAGNOSIS — N951 Menopausal and female climacteric states: Secondary | ICD-10-CM | POA: Diagnosis not present

## 2018-01-31 DIAGNOSIS — E559 Vitamin D deficiency, unspecified: Secondary | ICD-10-CM | POA: Diagnosis not present

## 2018-01-31 DIAGNOSIS — E60 Dietary zinc deficiency: Secondary | ICD-10-CM | POA: Diagnosis not present

## 2018-01-31 DIAGNOSIS — E612 Magnesium deficiency: Secondary | ICD-10-CM | POA: Diagnosis not present

## 2018-01-31 DIAGNOSIS — E538 Deficiency of other specified B group vitamins: Secondary | ICD-10-CM | POA: Diagnosis not present

## 2018-01-31 DIAGNOSIS — R5383 Other fatigue: Secondary | ICD-10-CM | POA: Diagnosis not present

## 2018-02-07 DIAGNOSIS — J40 Bronchitis, not specified as acute or chronic: Secondary | ICD-10-CM | POA: Diagnosis not present

## 2018-02-23 DIAGNOSIS — G2581 Restless legs syndrome: Secondary | ICD-10-CM | POA: Diagnosis not present

## 2018-03-11 DIAGNOSIS — R5383 Other fatigue: Secondary | ICD-10-CM | POA: Diagnosis not present

## 2018-03-11 DIAGNOSIS — E538 Deficiency of other specified B group vitamins: Secondary | ICD-10-CM | POA: Diagnosis not present

## 2018-03-11 DIAGNOSIS — E559 Vitamin D deficiency, unspecified: Secondary | ICD-10-CM | POA: Diagnosis not present

## 2018-03-11 DIAGNOSIS — E612 Magnesium deficiency: Secondary | ICD-10-CM | POA: Diagnosis not present

## 2018-03-27 DIAGNOSIS — R51 Headache: Secondary | ICD-10-CM | POA: Diagnosis not present

## 2018-03-27 DIAGNOSIS — J069 Acute upper respiratory infection, unspecified: Secondary | ICD-10-CM | POA: Diagnosis not present

## 2018-04-12 DIAGNOSIS — E60 Dietary zinc deficiency: Secondary | ICD-10-CM | POA: Diagnosis not present

## 2018-04-12 DIAGNOSIS — E559 Vitamin D deficiency, unspecified: Secondary | ICD-10-CM | POA: Diagnosis not present

## 2018-04-12 DIAGNOSIS — E538 Deficiency of other specified B group vitamins: Secondary | ICD-10-CM | POA: Diagnosis not present

## 2018-04-12 DIAGNOSIS — E612 Magnesium deficiency: Secondary | ICD-10-CM | POA: Diagnosis not present

## 2018-07-01 DIAGNOSIS — R5383 Other fatigue: Secondary | ICD-10-CM | POA: Diagnosis not present

## 2018-07-01 DIAGNOSIS — E559 Vitamin D deficiency, unspecified: Secondary | ICD-10-CM | POA: Diagnosis not present

## 2018-07-01 DIAGNOSIS — E612 Magnesium deficiency: Secondary | ICD-10-CM | POA: Diagnosis not present

## 2018-07-01 DIAGNOSIS — E538 Deficiency of other specified B group vitamins: Secondary | ICD-10-CM | POA: Diagnosis not present

## 2018-10-09 DIAGNOSIS — L237 Allergic contact dermatitis due to plants, except food: Secondary | ICD-10-CM | POA: Diagnosis not present

## 2018-10-15 DIAGNOSIS — L821 Other seborrheic keratosis: Secondary | ICD-10-CM | POA: Diagnosis not present

## 2018-10-15 DIAGNOSIS — D1801 Hemangioma of skin and subcutaneous tissue: Secondary | ICD-10-CM | POA: Diagnosis not present

## 2018-11-15 ENCOUNTER — Encounter: Payer: Self-pay | Admitting: Gastroenterology

## 2019-01-01 ENCOUNTER — Encounter: Payer: Self-pay | Admitting: Gastroenterology

## 2019-01-01 ENCOUNTER — Ambulatory Visit (INDEPENDENT_AMBULATORY_CARE_PROVIDER_SITE_OTHER): Payer: BC Managed Care – PPO | Admitting: Gastroenterology

## 2019-01-01 ENCOUNTER — Other Ambulatory Visit: Payer: Self-pay

## 2019-01-01 VITALS — BP 114/78 | HR 80 | Temp 98.4°F | Ht 63.5 in | Wt 138.1 lb

## 2019-01-01 DIAGNOSIS — R195 Other fecal abnormalities: Secondary | ICD-10-CM

## 2019-01-01 NOTE — Progress Notes (Addendum)
Referring Provider: Flo Shanks, NP Primary Care Physician:  Patient, No Pcp Per  Reason for Consultation: Irregular stools   IMPRESSION:  Periumbilical abdominal pain Irregular stools Abnormal stool findings Intermittent reflux Recent weight gain Fecal calprotectin 19.7 Normal screening colonoscopy 12/2016 (Dr. Penelope Coop) Negative testing for celiac 2018  Patient concerns for infectious process driving progressive symptomatology. Underlying IBS is suspected. I am afraid that she needs a tertiary care center evaluation to exclude rare infectious etiologies that are driving her concerns.  I recommended a trial of FDGard and a low FODMAP diet to try to minimize her symptoms in the meantime.  Evaluation of  IBS masqueraders including food intolerance (lactose, fructose, sucrose), SIBO, thyroid disorder deferred to tertiary care center to minimize duplicative testing.    PLAN: Trial of FDGard Low FODMAP diet recommended Referral to [facility] for further investigation  Please see the "Patient Instructions" section for addition details about the plan.  HPI: Jalicia Roszak is a 53 y.o. female referred by NP [facility] for further evaluation of stomach pain and bloating.The history is obtained through the patient, review of records provided by NP Bernadene Bell, and review of prior records from [facility].  She reports a history of anemia, anxiety, arhythmia. She has mold toxicity.  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   Progressive stomach issue over the last 2 years. Concerned about parasites and fungus.  Weird feelings in her midabdomen/periumbilical abdominal pain Immediate post-prandial bloating with unintentional weight gain Can no longer lay on her stomach because "something is there" Rare heartburn No early satiety Normal appetite Unintentional  weight gain. Size 6 her whole life, increased to size 10 over the last two years.  Reports constipation. Has a bowel movement daily with a sense of complete evacuation. Some mucous. No blood.   Alternating consistency of stools.  Sees round potato-like findings in her stool.   She has also noticed findings in her underwear. Dr. Bernadene Bell has treated her for parasites. She is concerned that this is what's causing the problem in her stomach. She's been looking at the organisms under the microscope. She wants to know what the eggs are. She shows me photos of them on her phone. Dr. Bernadene Bell thought it might be a mite. Unable to identify specific food triggers Uses no sugar substitutes, eats some dairy Uses Pepcid AC prn for heartburn Uses some simethicone  Labs performed at [facility] include: [place] 03/11/18 showed "moderate markers for SIBO, low SCFAs, low acidophilus and Breve, high levels of E Coli, Clostridium, Enterococcus. Fecal calprotectin 19.7 mcg/g. Testing of pancreatic elastase, bile acids, acetate, butyrate, short chain fatty acids were normal.   Screening colonoscopy with [facility] (Dr. Penelope Coop) 12/2016. Noted bloating at that time. Records show testing for celiac was negative. Although the study results were not included in her records.   NP Lackey treated her with a parasite protocol (cholestyramine, phosphtidul choline, and Tru Niagin) without success He recommended Enterovite, increased fiber intake, fresh thyme, daily tumeric, chamomile teas, [place] Natural Allirich, berberine, acidophilus, and Breve.  Tried pulse electromagnetic field machine made it worse.  Takes probiotics, oregano, sage, rosemary, calcium, B12, Vitamin D, K2, magnesium malate, zinc, Meg[employer]ore  Her personal online investigation has her concerned about Nano-Morgellans disease.  No sick contacts at home except for her dog who is having the findings in the  stool. Dog has mold toxicity. His  black snot also has organisms in it.   Parents are deceased. Limited information about her family history available. No known family history of colon cancer or polyps. No family history of uterine/endometrial cancer, pancreatic cancer or gastric/stomach cancer.   Past Medical History:  Diagnosis Date  . Heart murmur   . Hypertension     Past Surgical History:  Procedure Laterality Date  . KNEE ARTHROSCOPY W/ ACL RECONSTRUCTION    . LEEP  1999  . TONSILLECTOMY      Current Outpatient Medications  Medication Sig Dispense Refill  . cyclobenzaprine (FLEXERIL) 10 MG tablet TK 1 T PO BID  0  . gabapentin (NEURONTIN) 100 MG capsule TK 1 C AND NIGHT AND CAN INCREASE BY 1 C PER WEEK UP TO 3 CS QHS  3  . naproxen (NAPROSYN) 500 MG tablet Take 1 tablet (500 mg total) by mouth 2 (two) times daily with a meal. 30 tablet 0  . rOPINIRole (REQUIP XL) 8 MG 24 hr tablet TK 1 T PO QD  1   No current facility-administered medications for this visit.    Allergies as of 01/01/2019  . (No Known Allergies)    Family History  Problem Relation Age of Onset  . Diabetes Mother   . Hypertension Mother     Social History   Socioeconomic History  . Marital status: Divorced    Spouse name: Not on file  . Number of children: Not on file  . Years of education: Not on file  . Highest education level: Not on file  Occupational History  . Not on file  Tobacco Use  . Smoking status: Never Smoker  . Smokeless tobacco: Never Used  Substance and Sexual Activity  . Alcohol use: Yes    Comment: 3 times a week  . Drug use: No  . Sexual activity: Yes    Birth control/protection: None  Other Topics Concern  . Not on file  Social History Narrative  . Not on file   Social Determinants of Health   Financial Resource Strain:   . Difficulty of Paying Living Expenses: Not on file  Food Insecurity:   . Worried About Programme researcher, broadcasting/film/video in the Last Year: Not on file  . Ran Out of Food in the Last Year:  Not on file  Transportation Needs:   . Lack of Transportation (Medical): Not on file  . Lack of Transportation (Non-Medical): Not on file  Physical Activity:   . Days of Exercise per Week: Not on file  . Minutes of Exercise per Session: Not on file  Stress:   . Feeling of Stress : Not on file  Social Connections:   . Frequency of Communication with Friends and Family: Not on file  . Frequency of Social Gatherings with Friends and Family: Not on file  . Attends Religious Services: Not on file  . Active Member of Clubs or Organizations: Not on file  . Attends Banker Meetings: Not on file  . Marital Status: Not on file  Intimate Partner Violence:   . Fear of Current or Ex-Partner: Not on file  . Emotionally Abused: Not on file  . Physically Abused: Not on file  . Sexually Abused: Not on file    Review of Systems: 12 system ROS is negative except as noted above.   Physical Exam: General:   Alert,  well-nourished, pleasant and cooperative in NAD Head:  Normocephalic and atraumatic. Eyes:  Sclera clear, no icterus.   Conjunctiva pink. Ears:  Normal auditory acuity. Nose:  No deformity, discharge,  or lesions. Mouth:  No deformity or lesions.   Neck:  Supple; no masses or thyromegaly. Lungs:  Clear throughout to auscultation.   No wheezes. Heart:  Regular rate and rhythm; no murmurs. Abdomen:  Soft, nontender, nondistended, normal bowel sounds, no rebound or guarding. No hepatosplenomegaly.   Rectal:  Deferred  Msk:  Symmetrical. No boney deformities LAD: No inguinal or umbilical LAD Extremities:  No clubbing or edema. Neurologic:  Alert and  oriented x4;  grossly nonfocal Skin:  Intact without significant lesions or rashes. Psych:  Alert and cooperative. Normal mood and affect.    Tadd Holtmeyer L. Orvan FalconerBeavers, MD, MPH 01/01/2019, 8:58 AM

## 2019-01-01 NOTE — Patient Instructions (Addendum)
We have given you samples of a FDgard. Take as directed.  We have given you a low FODMAP diet.   We will refer you to [facility] to further investigate your symptoms and concerns for Nano-Morgellans disease.

## 2019-01-12 ENCOUNTER — Encounter: Payer: Self-pay | Admitting: Gastroenterology

## 2019-03-27 DIAGNOSIS — J4520 Mild intermittent asthma, uncomplicated: Secondary | ICD-10-CM | POA: Diagnosis not present

## 2019-05-14 DIAGNOSIS — L237 Allergic contact dermatitis due to plants, except food: Secondary | ICD-10-CM | POA: Diagnosis not present

## 2019-06-18 DIAGNOSIS — G2581 Restless legs syndrome: Secondary | ICD-10-CM | POA: Diagnosis not present

## 2019-09-09 DIAGNOSIS — G4709 Other insomnia: Secondary | ICD-10-CM | POA: Diagnosis not present

## 2019-09-16 DIAGNOSIS — R195 Other fecal abnormalities: Secondary | ICD-10-CM | POA: Diagnosis not present

## 2019-09-23 DIAGNOSIS — R195 Other fecal abnormalities: Secondary | ICD-10-CM | POA: Diagnosis not present

## 2019-10-16 IMAGING — DX DG SACRUM/COCCYX 2+V
3 series · 3 of 3 positions shown · non-contrast
Comparison: None.

CLINICAL DATA: Right inguinal pain secondary to a fall from a
ladder. Straddle injury.

EXAM:
SACRUM AND COCCYX - 2+ VIEW

[coccyx ap]
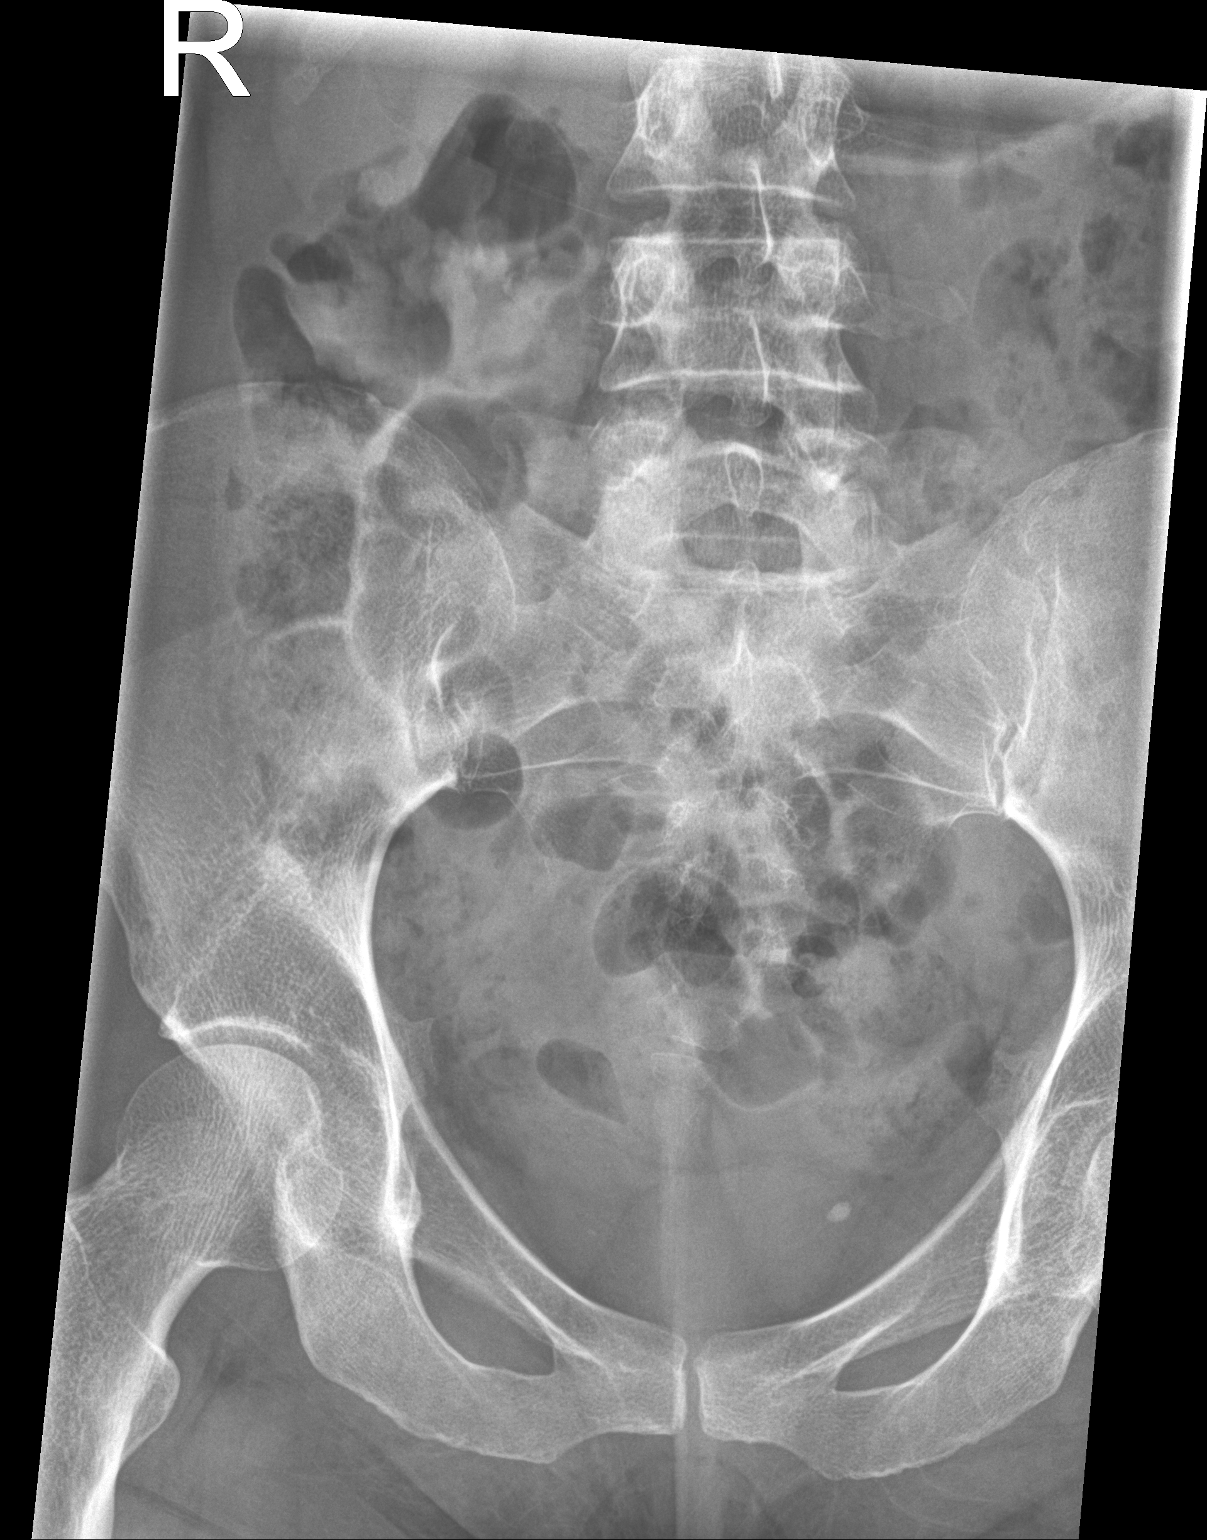

[sacrum ap]
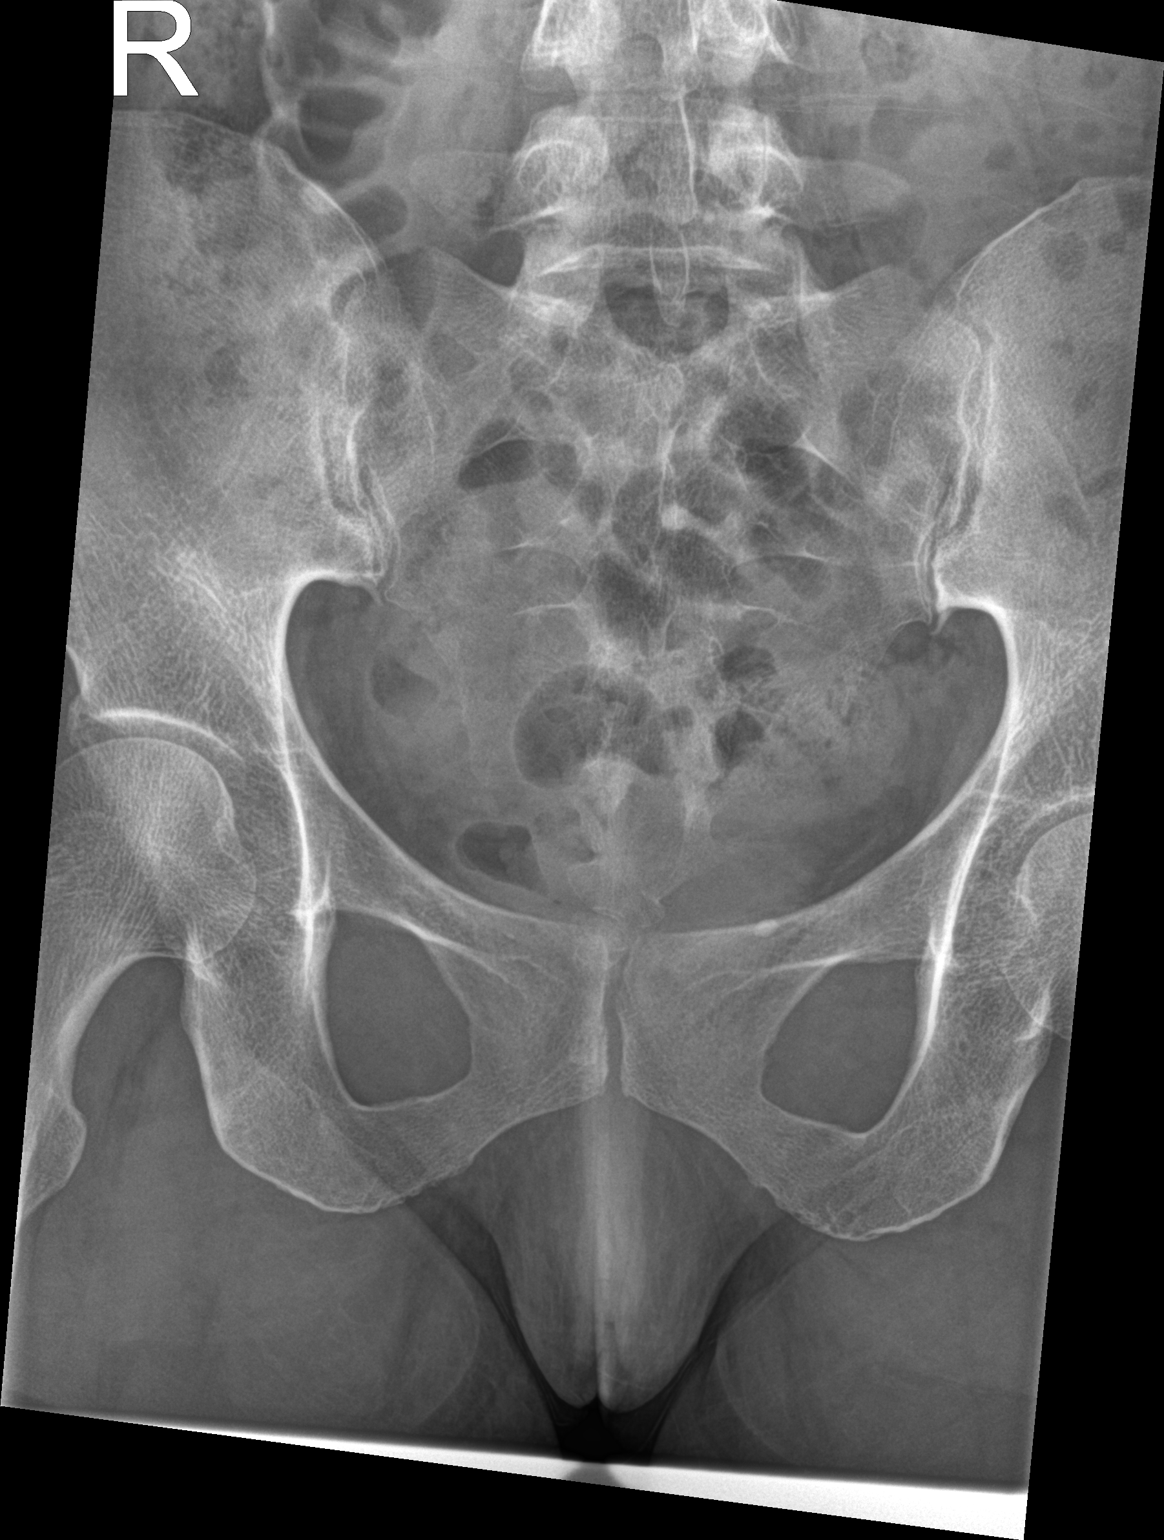

[sacrum lat]
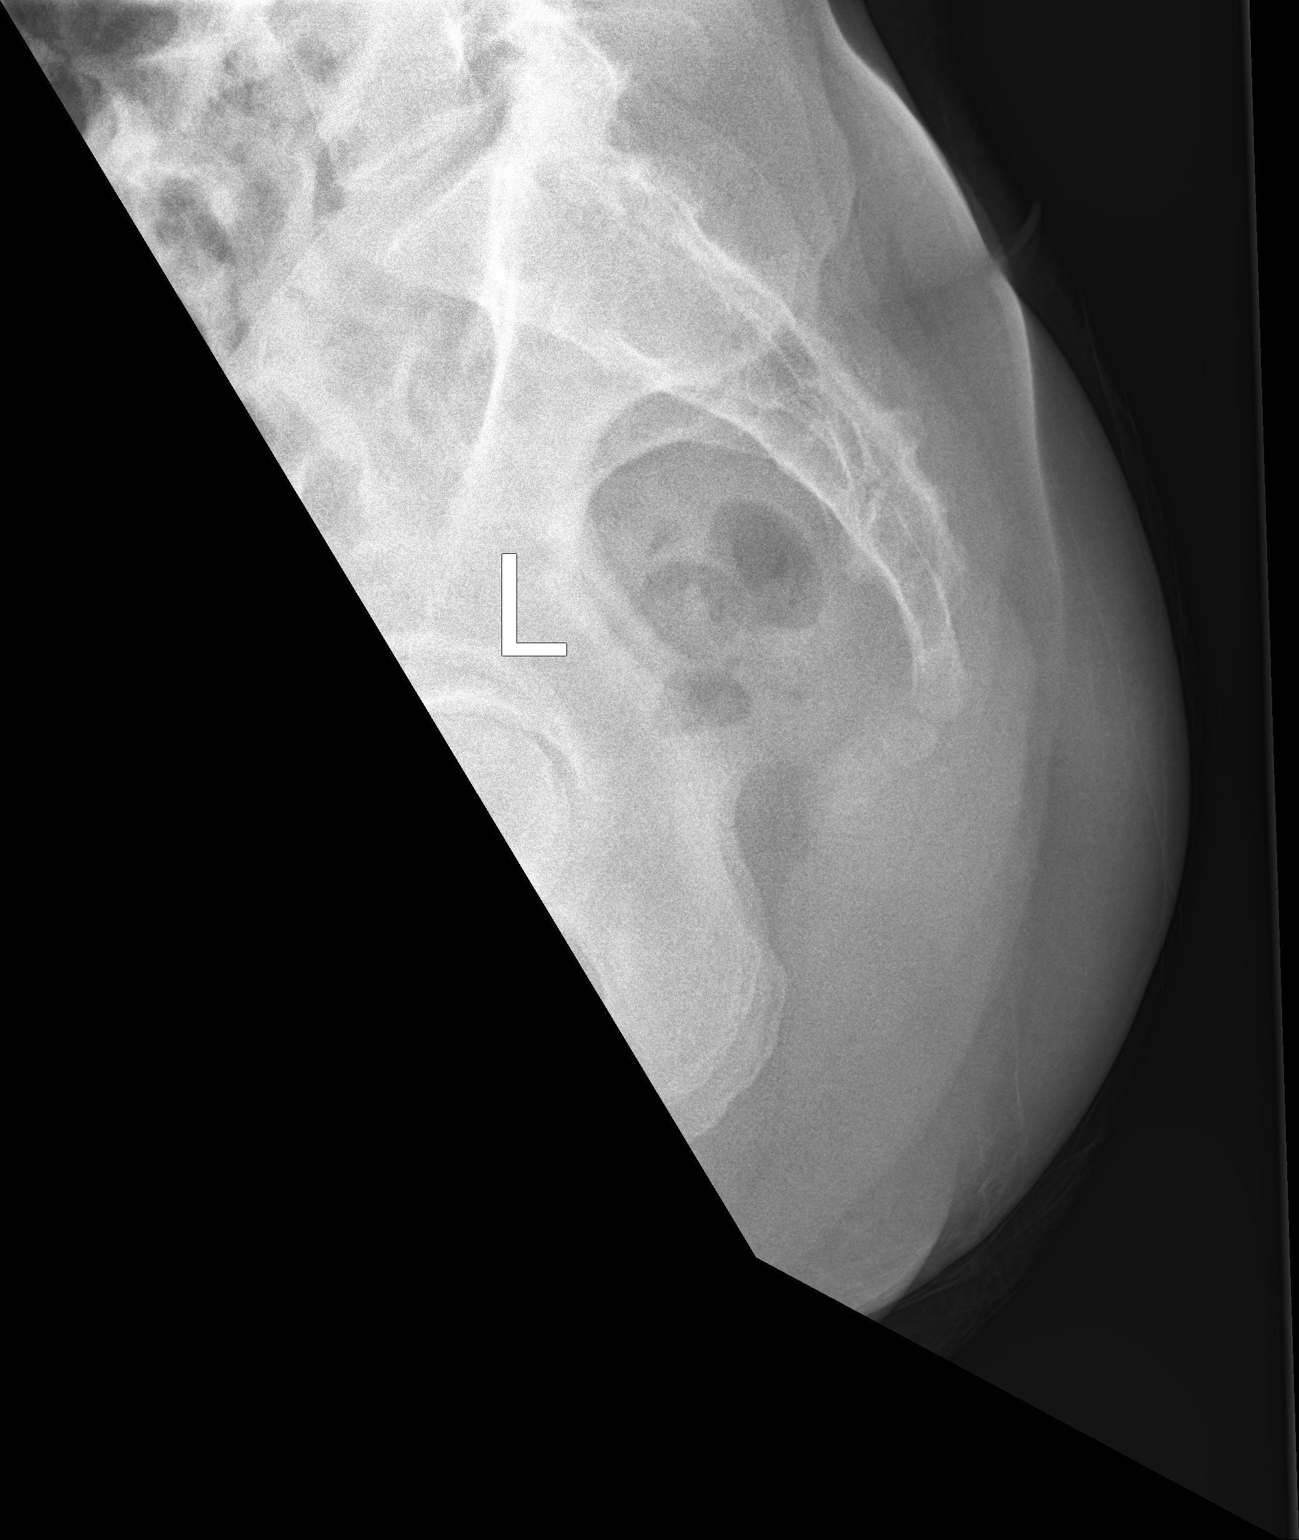

[3 of 3 positions shown; findings below may reference images not displayed]

FINDINGS: There is no evidence of fracture or other focal bone lesions.
IMPRESSION: Negative.

## 2019-10-16 IMAGING — DX DG PELVIS 1-2V
1 series · 1 of 1 positions shown · non-contrast
Comparison: None.

CLINICAL DATA: STATUS POST FALL

EXAM:
PELVIS - 1-2 VIEW

[pelvis ap]
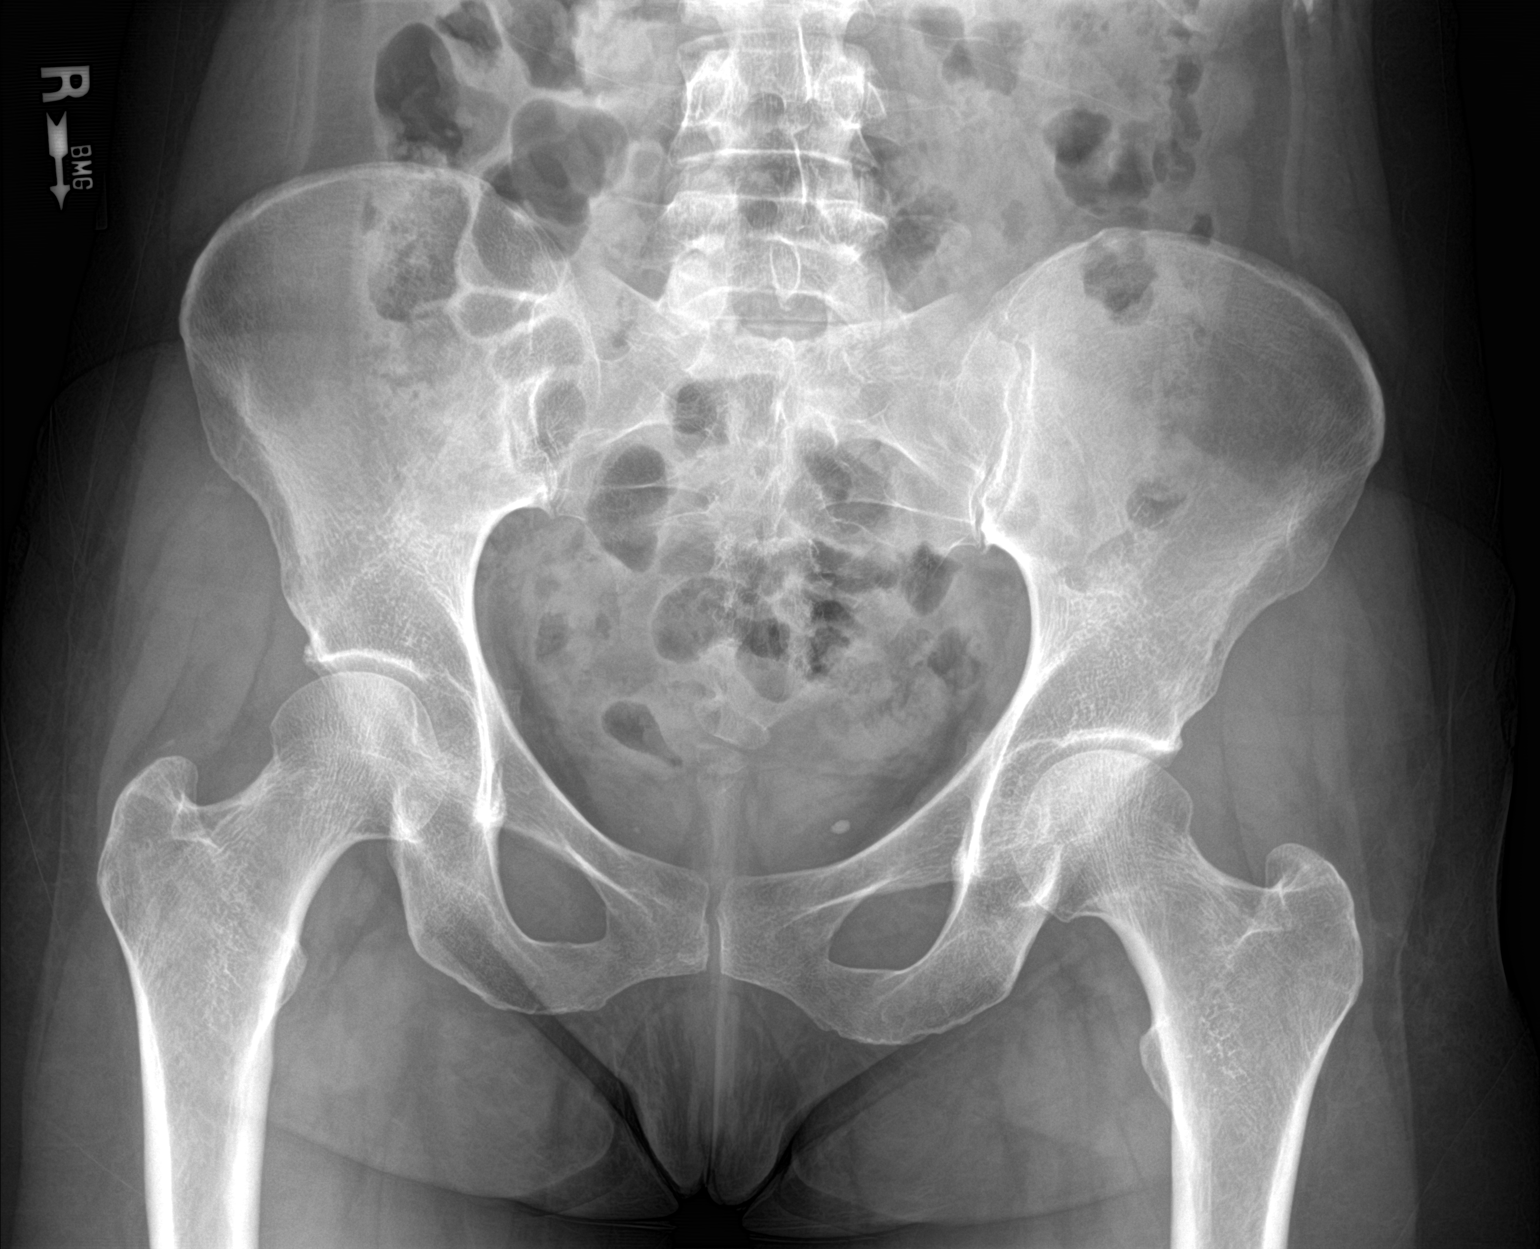

[1 of 1 positions shown; findings below may reference images not displayed]

FINDINGS: There is no evidence of pelvic fracture or diastasis. No pelvic bone
lesions are seen.
IMPRESSION: Negative.

## 2019-10-22 DIAGNOSIS — L237 Allergic contact dermatitis due to plants, except food: Secondary | ICD-10-CM | POA: Diagnosis not present

## 2019-11-14 DIAGNOSIS — R5383 Other fatigue: Secondary | ICD-10-CM | POA: Diagnosis not present

## 2019-11-14 DIAGNOSIS — T470X5A Adverse effect of histamine H2-receptor blockers, initial encounter: Secondary | ICD-10-CM | POA: Diagnosis not present

## 2019-11-14 DIAGNOSIS — E612 Magnesium deficiency: Secondary | ICD-10-CM | POA: Diagnosis not present

## 2019-11-14 DIAGNOSIS — E559 Vitamin D deficiency, unspecified: Secondary | ICD-10-CM | POA: Diagnosis not present

## 2019-11-14 DIAGNOSIS — E538 Deficiency of other specified B group vitamins: Secondary | ICD-10-CM | POA: Diagnosis not present

## 2019-11-14 DIAGNOSIS — E60 Dietary zinc deficiency: Secondary | ICD-10-CM | POA: Diagnosis not present

## 2019-11-14 DIAGNOSIS — Z1329 Encounter for screening for other suspected endocrine disorder: Secondary | ICD-10-CM | POA: Diagnosis not present

## 2019-11-14 DIAGNOSIS — B829 Intestinal parasitism, unspecified: Secondary | ICD-10-CM | POA: Diagnosis not present

## 2019-11-14 DIAGNOSIS — N951 Menopausal and female climacteric states: Secondary | ICD-10-CM | POA: Diagnosis not present

## 2019-12-25 DIAGNOSIS — R5383 Other fatigue: Secondary | ICD-10-CM | POA: Diagnosis not present

## 2019-12-25 DIAGNOSIS — B829 Intestinal parasitism, unspecified: Secondary | ICD-10-CM | POA: Diagnosis not present

## 2019-12-25 DIAGNOSIS — E60 Dietary zinc deficiency: Secondary | ICD-10-CM | POA: Diagnosis not present

## 2019-12-25 DIAGNOSIS — E612 Magnesium deficiency: Secondary | ICD-10-CM | POA: Diagnosis not present

## 2019-12-30 DIAGNOSIS — E612 Magnesium deficiency: Secondary | ICD-10-CM | POA: Diagnosis not present

## 2019-12-30 DIAGNOSIS — B829 Intestinal parasitism, unspecified: Secondary | ICD-10-CM | POA: Diagnosis not present

## 2019-12-30 DIAGNOSIS — E60 Dietary zinc deficiency: Secondary | ICD-10-CM | POA: Diagnosis not present

## 2019-12-30 DIAGNOSIS — A493 Mycoplasma infection, unspecified site: Secondary | ICD-10-CM | POA: Diagnosis not present

## 2020-04-01 DIAGNOSIS — M25561 Pain in right knee: Secondary | ICD-10-CM | POA: Diagnosis not present

## 2020-04-01 DIAGNOSIS — M1712 Unilateral primary osteoarthritis, left knee: Secondary | ICD-10-CM | POA: Diagnosis not present

## 2020-04-26 DIAGNOSIS — M79605 Pain in left leg: Secondary | ICD-10-CM | POA: Diagnosis not present

## 2020-05-07 DIAGNOSIS — M79605 Pain in left leg: Secondary | ICD-10-CM | POA: Diagnosis not present

## 2020-06-21 DIAGNOSIS — Z0000 Encounter for general adult medical examination without abnormal findings: Secondary | ICD-10-CM | POA: Diagnosis not present

## 2020-06-21 DIAGNOSIS — Z13220 Encounter for screening for lipoid disorders: Secondary | ICD-10-CM | POA: Diagnosis not present

## 2020-06-21 DIAGNOSIS — G2581 Restless legs syndrome: Secondary | ICD-10-CM | POA: Diagnosis not present

## 2020-06-21 DIAGNOSIS — F418 Other specified anxiety disorders: Secondary | ICD-10-CM | POA: Diagnosis not present

## 2020-06-21 DIAGNOSIS — Z131 Encounter for screening for diabetes mellitus: Secondary | ICD-10-CM | POA: Diagnosis not present

## 2020-07-15 ENCOUNTER — Encounter (HOSPITAL_BASED_OUTPATIENT_CLINIC_OR_DEPARTMENT_OTHER): Payer: Self-pay

## 2020-07-15 ENCOUNTER — Other Ambulatory Visit: Payer: Self-pay

## 2020-07-15 ENCOUNTER — Emergency Department (HOSPITAL_BASED_OUTPATIENT_CLINIC_OR_DEPARTMENT_OTHER)
Admission: EM | Admit: 2020-07-15 | Discharge: 2020-07-15 | Disposition: A | Discharge status: Home / Self Care | Payer: BC Managed Care – PPO | Attending: Emergency Medicine | Admitting: Emergency Medicine

## 2020-07-15 ENCOUNTER — Emergency Department (HOSPITAL_BASED_OUTPATIENT_CLINIC_OR_DEPARTMENT_OTHER): Payer: BC Managed Care – PPO

## 2020-07-15 DIAGNOSIS — I10 Essential (primary) hypertension: Secondary | ICD-10-CM | POA: Diagnosis not present

## 2020-07-15 DIAGNOSIS — J45909 Unspecified asthma, uncomplicated: Secondary | ICD-10-CM | POA: Insufficient documentation

## 2020-07-15 DIAGNOSIS — R002 Palpitations: Secondary | ICD-10-CM | POA: Diagnosis not present

## 2020-07-15 DIAGNOSIS — R079 Chest pain, unspecified: Secondary | ICD-10-CM | POA: Diagnosis not present

## 2020-07-15 LAB — CBC WITH DIFFERENTIAL/PLATELET
Abs Immature Granulocytes: 0.02 10*3/uL (ref 0.00–0.07)
Basophils Absolute: 0 10*3/uL (ref 0.0–0.1)
Basophils Relative: 0 %
Eosinophils Absolute: 0.1 10*3/uL (ref 0.0–0.5)
Eosinophils Relative: 1 %
HCT: 40.6 % (ref 36.0–46.0)
Hemoglobin: 13.5 g/dL (ref 12.0–15.0)
Immature Granulocytes: 0 %
Lymphocytes Relative: 34 %
Lymphs Abs: 2.2 10*3/uL (ref 0.7–4.0)
MCH: 30.8 pg (ref 26.0–34.0)
MCHC: 33.3 g/dL (ref 30.0–36.0)
MCV: 92.5 fL (ref 80.0–100.0)
Monocytes Absolute: 0.4 10*3/uL (ref 0.1–1.0)
Monocytes Relative: 7 %
Neutro Abs: 3.6 10*3/uL (ref 1.7–7.7)
Neutrophils Relative %: 58 %
Platelets: 261 10*3/uL (ref 150–400)
RBC: 4.39 MIL/uL (ref 3.87–5.11)
RDW: 11.9 % (ref 11.5–15.5)
WBC: 6.3 10*3/uL (ref 4.0–10.5)
nRBC: 0 % (ref 0.0–0.2)

## 2020-07-15 LAB — BASIC METABOLIC PANEL
Anion gap: 8 (ref 5–15)
BUN: 15 mg/dL (ref 6–20)
CO2: 29 mmol/L (ref 22–32)
Calcium: 9.2 mg/dL (ref 8.9–10.3)
Chloride: 101 mmol/L (ref 98–111)
Creatinine, Ser: 0.75 mg/dL (ref 0.44–1.00)
GFR, Estimated: >60 mL/min (ref >60)
Glucose, Bld: 91 mg/dL (ref 70–99)
Potassium: 3.5 mmol/L (ref 3.5–5.1)
Sodium: 138 mmol/L (ref 135–145)

## 2020-07-15 LAB — MAGNESIUM: Magnesium: 1.8 mg/dL (ref 1.7–2.4)

## 2020-07-15 LAB — TROPONIN I (HIGH SENSITIVITY)
Troponin I (High Sensitivity): <2 ng/L (ref <18)
Troponin I (High Sensitivity): <2 ng/L (ref <18)

## 2020-07-15 LAB — TSH: TSH: 2.827 u[IU]/mL (ref 0.350–4.500)

## 2020-07-15 NOTE — ED Notes (Signed)
Pt ambulatory to restroom with steady gait, NAD noted.

## 2020-07-15 NOTE — ED Triage Notes (Signed)
Pt c/o palpitations x 2 days-CP earlier-denies at present-NAD-steady gait

## 2020-07-15 NOTE — ED Provider Notes (Signed)
[facility] EMERGENCY DEPARTMENT Provider Note   CSN: [id number] Arrival date & time: 07/15/20  1315     History Chief Complaint  Patient presents with   Palpitations    Lindsay Butler is a 55 y.o. female.   Palpitations Associated symptoms: no back pain, no chest pain, no cough, no diaphoresis, no dizziness, no nausea, no numbness, no shortness of breath, no vomiting and no weakness       Lindsay Butler is a 55 y.o. female, with a history of anemia, anxiety, asthma, HTN, presenting to the ED with palpitations over the last couple days. She has had intermittent instances of palpitations for the last several years. Today was different as her palpitations were persistent.  They resolved just prior to arrival in the ED. She did have an episode of central chest discomfort that felt "electrical,", mild, nonradiating, occurred around 11 AM this morning, lasted around 1 minute before completely resolving. Intermittent, but not consistent, caffeine intake.  Occasional alcohol intake.  Denies illicit drugs.  Denies fever/chills, dizziness, syncope, recurrent chest pain, shortness of breath, recent illness, recent lower extremity edema/pain, abdominal pain, or any other complaints.  Past Medical History:  Diagnosis Date   Anemia    Anxiety    Asthma    Heart murmur    Hypertension    Restless leg syndrome     Patient Active Problem List   Diagnosis Date Noted   HGSIL on Pap smear of cervix 02/16/2016    Past Surgical History:  Procedure Laterality Date   KNEE ARTHROSCOPY W/ ACL RECONSTRUCTION Left    LEEP  1999   x 2   TONSILLECTOMY       OB History     Gravida  1   Para      Term      Preterm      AB  1   Living         SAB      IAB  1   Ectopic      Multiple      Live Births              Family History  Problem Relation Age of Onset   Diabetes Mother    Hypertension Mother    Kidney disease Mother    COPD Father     Social  History   Tobacco Use   Smoking status: Never   Smokeless tobacco: Never  Vaping Use   Vaping Use: Never used  Substance Use Topics   Alcohol use: Yes    Comment: occ   Drug use: No    Home Medications Prior to Admission medications   Medication Sig Start Date End Date Taking? Authorizing Provider  gabapentin (NEURONTIN) 100 MG capsule TK 1 C AND NIGHT AND CAN INCREASE BY 1 C PER WEEK UP TO 3 CS QHS 07/08/17   [provider]  rOPINIRole (REQUIP XL) 8 MG 24 hr tablet TK 1 T PO QD 08/02/17   [provider]    Allergies    Patient has no known allergies.  Review of Systems   Review of Systems  Constitutional:  Negative for chills, diaphoresis and fever.  Respiratory:  Negative for cough and shortness of breath.   Cardiovascular:  Positive for palpitations. Negative for chest pain and leg swelling.  Gastrointestinal:  Negative for abdominal pain, diarrhea, nausea and vomiting.  Musculoskeletal:  Negative for back pain and neck pain.  Neurological:  Negative for dizziness, syncope,  weakness and numbness.  All other systems reviewed and are negative.  Physical Exam Updated Vital Signs BP (!) 141/76 (BP Location: Right Arm)   Pulse 63   Temp 97.8 F (36.6 C) (Oral)   Resp 20   Ht 5\' 3"  (1.6 m)   Wt 62.6 kg   LMP 03/01/2016 (Exact Date)   SpO2 100%   BMI 24.45 kg/m   Physical Exam Vitals and nursing note reviewed.  Constitutional:      General: She is not in acute distress.    Appearance: She is well-developed. She is not diaphoretic.  HENT:     Head: Normocephalic and atraumatic.     Mouth/Throat:     Mouth: Mucous membranes are moist.     Pharynx: Oropharynx is clear.  Eyes:     Conjunctiva/sclera: Conjunctivae normal.  Cardiovascular:     Rate and Rhythm: Normal rate and regular rhythm.     Pulses: Normal pulses.          Radial pulses are 2+ on the right side and 2+ on the left side.       Posterior tibial pulses are 2+ on the right side  and 2+ on the left side.     Heart sounds: Normal heart sounds.     Comments: Tactile temperature in the extremities appropriate and equal bilaterally. Pulmonary:     Effort: Pulmonary effort is normal. No respiratory distress.     Breath sounds: Normal breath sounds.  Abdominal:     Tenderness: There is no guarding.  Musculoskeletal:     Cervical back: Neck supple.     Right lower leg: No edema.     Left lower leg: No edema.  Skin:    General: Skin is warm and dry.  Neurological:     Mental Status: She is alert.  Psychiatric:        Mood and Affect: Mood and affect normal.        Speech: Speech normal.        Behavior: Behavior normal.    ED Results / Procedures / Treatments   Labs (all labs ordered are listed, but only abnormal results are displayed) Labs Reviewed  BASIC METABOLIC PANEL  CBC WITH DIFFERENTIAL/PLATELET  MAGNESIUM  TSH  TROPONIN I (HIGH SENSITIVITY)  TROPONIN I (HIGH SENSITIVITY)    EKG EKG Interpretation  Date/Time:  Thursday July 15 2020 13:24:47 EDT Ventricular Rate:  58 PR Interval:  148 QRS Duration: 93 QT Interval:  401 QTC Calculation: 394 R Axis:   93 Text Interpretation: Duplicate Confirmed by [email or web address] [phone]) on 07/15/2020 1:36:28 PM  Radiology DG Chest 2 View  Result Date: 07/15/2020 CLINICAL DATA:  Chest pain palpitation EXAM: CHEST - 2 VIEW COMPARISON:  None. FINDINGS: The heart size and mediastinal contours are within normal limits. Both lungs are clear. Mild scoliosis. IMPRESSION: No active cardiopulmonary disease. Electronically Signed   By: 07/17/2020 M.D.   On: 07/15/2020 14:25    Procedures Procedures   Medications Ordered in ED Medications - No data to display  ED Course  I have reviewed the triage vital signs and the nursing notes.  Pertinent labs & imaging results that were available during my care of the patient were reviewed by me and considered in my medical decision making (see chart for details).     MDM Rules/Calculators/A&P  Patient presents with complaint of intermittent palpitations. Patient is nontoxic appearing, afebrile, not tachycardic, not tachypneic, not hypotensive, maintains excellent SPO2 on room air, and is in no apparent distress.   I have reviewed the patient's chart to obtain more information.   I reviewed and interpreted the patient's labs and radiological studies. Patient's work-up here reassuring.  TSH is a send out test for this facility and will not result during the patient's ED course.  This was discussed with the patient.  It is actually of more use to the cardiologist as she does not present as a thyroid dysfunction emergency. Follow-up with cardiology discussed with the patient.  Referral given. Return precautions discussed.  Patient voices understanding of these instructions, accepts the plan, and is comfortable with discharge.    Vitals:   07/15/20 1330 07/15/20 1430 07/15/20 1500 07/15/20 1530  BP: (!) 144/84 133/88 127/90 (!) 130/108  Pulse: 61 (!) 57 61 (!) 58  Resp: 10 14 (!) 27 11  Temp:      TempSrc:      SpO2: 100% 100% 97% 100%  Weight:      Height:         Final Clinical Impression(s) / ED Diagnoses Final diagnoses:  Palpitations    Rx / DC Orders ED Discharge Orders          Ordered    Ambulatory referral to Cardiology        07/15/20 1534             Concepcion Living 07/15/20 1606    Pollyann Savoy, MD 07/17/20 [phone]

## 2020-07-15 NOTE — Discharge Instructions (Addendum)
Make sure to stay well-hydrated by drinking plenty of water.  Eat regular meals.  Get up and walk around throughout the day. Follow-up with cardiology on this matter.  Call to make an appointment. Return to the emergency department for chest pain, shortness of breath, dizziness, passing out, or any other major concerns.

## 2020-07-15 NOTE — ED Notes (Signed)
Called lab to add on magnesium to blood in lab.

## 2020-07-15 NOTE — ED Notes (Signed)
Pt verbalized understanding of discharge paperwork and follow-up care.  °

## 2020-08-05 DIAGNOSIS — D649 Anemia, unspecified: Secondary | ICD-10-CM | POA: Insufficient documentation

## 2020-08-05 DIAGNOSIS — I10 Essential (primary) hypertension: Secondary | ICD-10-CM | POA: Insufficient documentation

## 2020-08-05 DIAGNOSIS — J45909 Unspecified asthma, uncomplicated: Secondary | ICD-10-CM | POA: Insufficient documentation

## 2020-08-05 DIAGNOSIS — R011 Cardiac murmur, unspecified: Secondary | ICD-10-CM | POA: Insufficient documentation

## 2020-08-05 DIAGNOSIS — G2581 Restless legs syndrome: Secondary | ICD-10-CM | POA: Insufficient documentation

## 2020-08-05 DIAGNOSIS — F419 Anxiety disorder, unspecified: Secondary | ICD-10-CM | POA: Insufficient documentation

## 2020-08-18 ENCOUNTER — Other Ambulatory Visit: Payer: Self-pay

## 2020-08-18 ENCOUNTER — Ambulatory Visit (INDEPENDENT_AMBULATORY_CARE_PROVIDER_SITE_OTHER): Payer: BC Managed Care – PPO

## 2020-08-18 ENCOUNTER — Encounter: Payer: Self-pay | Admitting: Cardiology

## 2020-08-18 ENCOUNTER — Ambulatory Visit (INDEPENDENT_AMBULATORY_CARE_PROVIDER_SITE_OTHER): Payer: BC Managed Care – PPO | Admitting: Cardiology

## 2020-08-18 VITALS — BP 101/70 | HR 78 | Ht 64.0 in | Wt 137.0 lb

## 2020-08-18 DIAGNOSIS — E559 Vitamin D deficiency, unspecified: Secondary | ICD-10-CM

## 2020-08-18 DIAGNOSIS — R001 Bradycardia, unspecified: Secondary | ICD-10-CM

## 2020-08-18 DIAGNOSIS — R002 Palpitations: Secondary | ICD-10-CM | POA: Diagnosis not present

## 2020-08-18 DIAGNOSIS — R5383 Other fatigue: Secondary | ICD-10-CM | POA: Diagnosis not present

## 2020-08-18 DIAGNOSIS — R011 Cardiac murmur, unspecified: Secondary | ICD-10-CM | POA: Diagnosis not present

## 2020-08-18 NOTE — Progress Notes (Signed)
Cardiology Office Note:    Date:  08/19/2020   ID:  Lindsay Butler, DOB May 03, 1965, MRN [id number]  PCP:  Patient, No Pcp Per (Inactive)  Cardiologist:  Thomasene Ripple, DO  Electrophysiologist:  None   Referring MD: Anselm Pancoast, PA-C   " I have been having some heart fluttering"   History of Present Illness:    Lindsay Butler is a 55 y.o. female with a hx of hypertension, asthma, anxiety is here today to be evaluated for palpitations.  Patient tells me that she has been experiencing intermittent palpitations.  She notes that it is appropriate for the fast heartbeat would come last for several minutes at a time prior to resolution.  She tells me she has this been going on over several years but in the last 3 weeks it has been more in the episodes.  And recently she has associated chest discomfort with this.  No lightheadedness no dizziness.  No shortness of breath.  Past Medical History:  Diagnosis Date   Anemia    Anxiety    Asthma    Heart murmur    Hypertension    Restless leg syndrome     Past Surgical History:  Procedure Laterality Date   KNEE ARTHROSCOPY W/ ACL RECONSTRUCTION Left    LEEP  1999   x 2   TONSILLECTOMY      Current Medications: Current Meds  Medication Sig   ALPRAZolam (XANAX) 0.5 MG tablet Take 0.5 mg by mouth 3 (three) times daily as needed for anxiety.   gabapentin (NEURONTIN) 100 MG capsule Take 100 mg by mouth daily as needed (restless legs).   Multiple Vitamin (MULTI-VITAMIN DAILY PO) Take 1 tablet by mouth daily.   rOPINIRole (REQUIP XL) 8 MG 24 hr tablet TK 1 T PO QD   vitamin C (ASCORBIC ACID) 500 MG tablet Take 500 mg by mouth daily.   VITAMIN D PO Take 1 tablet by mouth daily.   Zinc 50 MG TABS Take 1 tablet by mouth every other day.     Allergies:   Patient has no known allergies.   Social History   Socioeconomic History   Marital status: Divorced    Spouse name: Not on file   Number of children: 0   Years of education: Not on file    Highest education level: Not on file  Occupational History   Occupation: client service rep  Tobacco Use   Smoking status: Never   Smokeless tobacco: Never  Vaping Use   Vaping Use: Never used  Substance and Sexual Activity   Alcohol use: Yes    Comment: occ   Drug use: No   Sexual activity: Yes    Birth control/protection: None  Other Topics Concern   Not on file  Social History Narrative   Not on file   Social Determinants of Health   Financial Resource Strain: Not on file  Food Insecurity: Not on file  Transportation Needs: Not on file  Physical Activity: Not on file  Stress: Not on file  Social Connections: Not on file     Family History: The patient's family history includes COPD in her father; Diabetes in her mother; Hypertension in her mother; Kidney disease in her mother.  ROS:   Review of Systems  Constitution: Negative for decreased appetite, fever and weight gain.  HENT: Negative for congestion, ear discharge, hoarse voice and sore throat.   Eyes: Negative for discharge, redness, vision loss in right eye and visual halos.  Cardiovascular: Reports palpitation.  Negative for chest pain, dyspnea on exertion, leg swelling, orthopnea. Respiratory: Negative for cough, hemoptysis, shortness of breath and snoring.   Endocrine: Negative for heat intolerance and polyphagia.  Hematologic/Lymphatic: Negative for bleeding problem. Does not bruise/bleed easily.  Skin: Negative for flushing, nail changes, rash and suspicious lesions.  Musculoskeletal: Negative for arthritis, joint pain, muscle cramps, myalgias, neck pain and stiffness.  Gastrointestinal: Negative for abdominal pain, bowel incontinence, diarrhea and excessive appetite.  Genitourinary: Negative for decreased libido, genital sores and incomplete emptying.  Neurological: Negative for brief paralysis, focal weakness, headaches and loss of balance.  Psychiatric/Behavioral: Negative for altered mental status,  depression and suicidal ideas.  Allergic/Immunologic: Negative for HIV exposure and persistent infections.    EKGs/Labs/Other Studies Reviewed:    The following studies were reviewed today:   EKG:  The ekg ordered today demonstrates sinus bradycardia, heart rate 58 bpm with nonspecific ST changes   Recent Labs: 07/15/2020: BUN 15; Creatinine, Ser 0.75; Hemoglobin 13.5; Magnesium 1.8; Platelets 261; Potassium 3.5; Sodium 138; TSH 2.827  Recent Lipid Panel No results found for: CHOL, TRIG, HDL, CHOLHDL, VLDL, LDLCALC, LDLDIRECT  Physical Exam:    VS:  BP 101/70 (BP Location: Left Arm, Patient Position: Sitting, Cuff Size: Normal)   Pulse 78   Ht 5\' 4"  (1.626 m)   Wt 137 lb (62.1 kg)   LMP 03/01/2016 (Exact Date)   SpO2 98%   BMI 23.52 kg/m     Wt Readings from Last 3 Encounters:  08/18/20 137 lb (62.1 kg)  07/15/20 138 lb (62.6 kg)  01/01/19 138 lb 2 oz (62.7 kg)     GEN: Well nourished, well developed in no acute distress HEENT: Normal NECK: No JVD; No carotid bruits LYMPHATICS: No lymphadenopathy CARDIAC: S1S2 noted,RRR, 2/6 midsystolic ejection murmurs, rubs, gallops RESPIRATORY:  Clear to auscultation without rales, wheezing or rhonchi  ABDOMEN: Soft, non-tender, non-distended, +bowel sounds, no guarding. EXTREMITIES: No edema, No cyanosis, no clubbing MUSCULOSKELETAL:  No deformity  SKIN: Warm and dry NEUROLOGIC:  Alert and oriented x 3, non-focal PSYCHIATRIC:  Normal affect, good insight  ASSESSMENT:    1. Sinus bradycardia   2. Palpitations   3. Murmur   4. Fatigue, unspecified type   5. Vitamin D deficiency    PLAN:     I would like to rule out a cardiovascular etiology of this palpitation, therefore at this time I would like to placed a zio patch for   14  days. In additon with her murmur on auscultation a transthoracic echocardiogram will be ordered to assess LV/RV function and any structural abnormalities. Once these testing have been performed amd  reviewed further reccomendations will be made. For now, I do reccomend that the patient goes to the nearest ED if  symptoms recur.  We will get blood work for vitamin D level.  The patient is in agreement with the above plan. The patient left the office in stable condition.  The patient will follow up in   Medication Adjustments/Labs and Tests Ordered: Current medicines are reviewed at length with the patient today.  Concerns regarding medicines are outlined above.  Orders Placed This Encounter  Procedures   Vitamin D 1,25 dihydroxy   LONG TERM MONITOR (3-14 DAYS)   ECHOCARDIOGRAM COMPLETE   No orders of the defined types were placed in this encounter.   Patient Instructions  Medication Instructions:  Your physician recommends that you continue on your current medications as directed. Please refer to the Current  Medication list given to you today.  *If you need a refill on your cardiac medications before your next appointment, please call your pharmacy*   Lab Work: Your physician recommends that you return for lab work in: TODAY: Vitamin D If you have labs (blood work) drawn today and your tests are completely normal, you will receive your results only by: MyChart Message (if you have MyChart) OR A paper copy in the mail If you have any lab test that is abnormal or we need to change your treatment, we will call you to review the results.   Testing/Procedures: Your physician has requested that you have an echocardiogram. Echocardiography is a painless test that uses sound waves to create images of your heart. It provides your doctor with information about the size and shape of your heart and how well your heart's chambers and valves are working. This procedure takes approximately one hour. There are no restrictions for this procedure.  A zio monitor was ordered today. It will remain on for 14 days. You will then return monitor and event diary in provided box. It takes 1-2 weeks  for report to be downloaded and returned to [place]. We will call you with the results. If monitor falls off or has orange flashing light, please call Zio for further instructions.     Follow-Up: At [facility], you and your health needs are our priority.  As part of our continuing mission to provide you with exceptional heart care, we have created designated Provider Care Teams.  These Care Teams include your primary Cardiologist (physician) and Advanced Practice Providers (APPs -  Physician Assistants and Nurse Practitioners) who all work together to provide you with the care you need, when you need it.  We recommend signing up for the patient portal called "MyChart".  Sign up information is provided on this After Visit Summary.  MyChart is used to connect with patients for Virtual Visits (Telemedicine).  Patients are able to view lab/test results, encounter notes, upcoming appointments, etc.  Non-urgent messages can be sent to your provider as well.   To learn more about what you can do with MyChart, go to [email or web address].    Your next appointment:   4 month(s)  The format for your next appointment:   In Person    Other Instructions    Adopting a Healthy Lifestyle.  Know what a healthy weight is for you (roughly BMI <25) and aim to maintain this   Aim for 7+ servings of fruits and vegetables daily   65-80+ fluid ounces of water or unsweet tea for healthy kidneys   Limit to max 1 drink of alcohol per day; avoid smoking/tobacco   Limit animal fats in diet for cholesterol and heart health - choose grass fed whenever available   Avoid highly processed foods, and foods high in saturated/trans fats   Aim for low stress - take time to unwind and care for your mental health   Aim for 150 min of moderate intensity exercise weekly for heart health, and weights twice weekly for bone health   Aim for 7-9 hours of sleep daily   When it comes to diets, agreement about the perfect  plan isnt easy to find, even among the experts. Experts at the [facility] [employer] developed an idea known as the Healthy Eating Plate. Just imagine a plate divided into logical, healthy portions.   The emphasis is on diet quality:   Load up on vegetables and fruits - one-half  of your plate: Aim for color and variety, and remember that potatoes dont count.   Go for whole grains - one-quarter of your plate: Whole wheat, barley, wheat berries, quinoa, oats, brown rice, and foods made with them. If you want pasta, go with whole wheat pasta.   Protein power - one-quarter of your plate: Fish, chicken, beans, and nuts are all healthy, versatile protein sources. Limit red meat.   The diet, however, does go beyond the plate, offering a few other suggestions.   Use healthy plant oils, such as olive, canola, soy, corn, sunflower and peanut. Check the labels, and avoid partially hydrogenated oil, which have unhealthy trans fats.   If youre thirsty, drink water. Coffee and tea are good in moderation, but skip sugary drinks and limit milk and dairy products to one or two daily servings.   The type of carbohydrate in the diet is more important than the amount. Some sources of carbohydrates, such as vegetables, fruits, whole grains, and beans-are healthier than others.   Finally, stay active  Signed, Thomasene Ripple, DO  08/19/2020 8:22 PM    [facility]

## 2020-08-18 NOTE — Patient Instructions (Signed)
Medication Instructions:  Your physician recommends that you continue on your current medications as directed. Please refer to the Current Medication list given to you today.  *If you need a refill on your cardiac medications before your next appointment, please call your pharmacy*   Lab Work: Your physician recommends that you return for lab work in: TODAY: Vitamin D If you have labs (blood work) drawn today and your tests are completely normal, you will receive your results only by: MyChart Message (if you have MyChart) OR A paper copy in the mail If you have any lab test that is abnormal or we need to change your treatment, we will call you to review the results.   Testing/Procedures: Your physician has requested that you have an echocardiogram. Echocardiography is a painless test that uses sound waves to create images of your heart. It provides your doctor with information about the size and shape of your heart and how well your heart's chambers and valves are working. This procedure takes approximately one hour. There are no restrictions for this procedure.  A zio monitor was ordered today. It will remain on for 14 days. You will then return monitor and event diary in provided box. It takes 1-2 weeks for report to be downloaded and returned to [place]. We will call you with the results. If monitor falls off or has orange flashing light, please call Zio for further instructions.     Follow-Up: At [facility], you and your health needs are our priority.  As part of our continuing mission to provide you with exceptional heart care, we have created designated Provider Care Teams.  These Care Teams include your primary Cardiologist (physician) and Advanced Practice Providers (APPs -  Physician Assistants and Nurse Practitioners) who all work together to provide you with the care you need, when you need it.  We recommend signing up for the patient portal called "MyChart".  Sign up information  is provided on this After Visit Summary.  MyChart is used to connect with patients for Virtual Visits (Telemedicine).  Patients are able to view lab/test results, encounter notes, upcoming appointments, etc.  Non-urgent messages can be sent to your provider as well.   To learn more about what you can do with MyChart, go to [email or web address].    Your next appointment:   4 month(s)  The format for your next appointment:   In Person    Other Instructions

## 2020-08-23 ENCOUNTER — Other Ambulatory Visit: Payer: Self-pay

## 2020-08-23 ENCOUNTER — Ambulatory Visit (HOSPITAL_BASED_OUTPATIENT_CLINIC_OR_DEPARTMENT_OTHER)
Admission: RE | Admit: 2020-08-23 | Discharge: 2020-08-23 | Disposition: A | Discharge status: Home / Self Care | Payer: BC Managed Care – PPO | Source: Ambulatory Visit | Attending: Cardiology | Admitting: Cardiology

## 2020-08-23 DIAGNOSIS — R011 Cardiac murmur, unspecified: Secondary | ICD-10-CM | POA: Insufficient documentation

## 2020-08-23 DIAGNOSIS — R5383 Other fatigue: Secondary | ICD-10-CM | POA: Diagnosis not present

## 2020-08-23 DIAGNOSIS — R002 Palpitations: Secondary | ICD-10-CM

## 2020-08-23 LAB — ECHOCARDIOGRAM COMPLETE
AV Mean grad: 5 mmHg
AV Peak grad: 11.7 mmHg
Ao pk vel: 1.71 m/s
Area-P 1/2: 3 cm2
Calc EF: 64.7 %
S' Lateral: 2.31 cm
Single Plane A2C EF: 70.6 %
Single Plane A4C EF: 58.1 %

## 2020-08-23 NOTE — Progress Notes (Signed)
*  PRELIMINARY RESULTS* Echocardiogram 2D Echocardiogram has been performed.  Neomia Dear RDCS 08/23/2020, 8:57 AM

## 2020-08-26 LAB — VITAMIN D 1,25 DIHYDROXY
Vitamin D 1, 25 (OH)2 Total: 61 pg/mL
Vitamin D2 1, 25 (OH)2: <10 pg/mL
Vitamin D3 1, 25 (OH)2: 61 pg/mL

## 2020-09-02 DIAGNOSIS — J209 Acute bronchitis, unspecified: Secondary | ICD-10-CM | POA: Diagnosis not present

## 2020-09-02 DIAGNOSIS — J069 Acute upper respiratory infection, unspecified: Secondary | ICD-10-CM | POA: Diagnosis not present

## 2020-09-02 DIAGNOSIS — Z8616 Personal history of COVID-19: Secondary | ICD-10-CM | POA: Diagnosis not present

## 2020-09-07 DIAGNOSIS — R002 Palpitations: Secondary | ICD-10-CM | POA: Diagnosis not present

## 2020-09-10 ENCOUNTER — Other Ambulatory Visit: Payer: Self-pay | Admitting: Obstetrics & Gynecology

## 2020-09-10 DIAGNOSIS — Z1231 Encounter for screening mammogram for malignant neoplasm of breast: Secondary | ICD-10-CM

## 2020-09-14 DIAGNOSIS — M1712 Unilateral primary osteoarthritis, left knee: Secondary | ICD-10-CM | POA: Diagnosis not present

## 2020-09-15 ENCOUNTER — Telehealth: Payer: Self-pay

## 2020-09-15 NOTE — Telephone Encounter (Signed)
-----   Message from Thomasene Ripple, DO sent at 09/14/2020  5:07 PM EDT ----- Your monitor showed only a few beats of SVT.  For now we will prefer we monitor you clinically.  We can discuss further at your follow-up visit.

## 2020-09-15 NOTE — Telephone Encounter (Signed)
Spoke with patient regarding results and recommendation.  Patient verbalizes understanding and is agreeable to plan of care. Advised patient to call back with any issues or concerns.  

## 2020-09-24 ENCOUNTER — Other Ambulatory Visit: Payer: Self-pay

## 2020-09-24 ENCOUNTER — Ambulatory Visit
Admission: RE | Admit: 2020-09-24 | Discharge: 2020-09-24 | Disposition: A | Discharge status: Home / Self Care | Payer: BC Managed Care – PPO | Source: Ambulatory Visit

## 2020-09-24 DIAGNOSIS — Z1231 Encounter for screening mammogram for malignant neoplasm of breast: Secondary | ICD-10-CM

## 2020-12-13 ENCOUNTER — Encounter: Payer: Self-pay | Admitting: Cardiology

## 2020-12-13 ENCOUNTER — Other Ambulatory Visit: Payer: Self-pay

## 2020-12-13 ENCOUNTER — Ambulatory Visit (INDEPENDENT_AMBULATORY_CARE_PROVIDER_SITE_OTHER): Payer: BC Managed Care – PPO | Admitting: Cardiology

## 2020-12-13 VITALS — BP 118/78 | HR 67 | Resp 20 | Ht 64.0 in | Wt 138.6 lb

## 2020-12-13 DIAGNOSIS — R0789 Other chest pain: Secondary | ICD-10-CM | POA: Diagnosis not present

## 2020-12-13 DIAGNOSIS — I471 Supraventricular tachycardia: Secondary | ICD-10-CM | POA: Diagnosis not present

## 2020-12-13 MED ORDER — PROPRANOLOL HCL 10 MG PO TABS: 10.0000 mg | ORAL_TABLET | Freq: Every evening | ORAL | 3 refills | Status: DC

## 2020-12-13 NOTE — Patient Instructions (Addendum)
Medication Instructions:  Your physician has recommended you make the following change in your medication:  START: Propranolol 10 mg at night *If you need a refill on your cardiac medications before your next appointment, please call your pharmacy*   Lab Work: None If you have labs (blood work) drawn today and your tests are completely normal, you will receive your results only by: MyChart Message (if you have MyChart) OR A paper copy in the mail If you have any lab test that is abnormal or we need to change your treatment, we will call you to review the results.   Testing/Procedures: None   Follow-Up: At [facility], you and your health needs are our priority.  As part of our continuing mission to provide you with exceptional heart care, we have created designated Provider Care Teams.  These Care Teams include your primary Cardiologist (physician) and Advanced Practice Providers (APPs -  Physician Assistants and Nurse Practitioners) who all work together to provide you with the care you need, when you need it.  We recommend signing up for the patient portal called "MyChart".  Sign up information is provided on this After Visit Summary.  MyChart is used to connect with patients for Virtual Visits (Telemedicine).  Patients are able to view lab/test results, encounter notes, upcoming appointments, etc.  Non-urgent messages can be sent to your provider as well.   To learn more about what you can do with MyChart, go to [email or web address].    Your next appointment:   1 year(s)  The format for your next appointment:   In Person  Provider:   Thomasene Ripple, DO     Other Instructions  Please send [place] an update after 2 weeks.

## 2020-12-13 NOTE — Progress Notes (Signed)
Cardiology Office Note:    Date:  12/13/2020   ID:  Lindsay Butler, DOB [birth year], MRN [id number]  PCP:  Patient, No Pcp Per (Inactive)  Cardiologist:  Thomasene Ripple, DO  Electrophysiologist:  None   Referring MD: No ref. provider found   "I am still having some chest pain"   History of Present Illness:    Lindsay Butler is a 55 y.o. female with a hx of hypertension, asthma, anxiety here today for follow-up visit.  Counseled the patient August 18, 2020 at that time she presented as she was experiencing palpitations with lightheadedness and dizziness.  During that visit she also reported to me that she had some associated bradycardia.  At the conclusion of that visit I recommended the patient wear ZIO monitor for 14 days and get an echocardiogram given the fact that she had heart murmur.  We also recommended to get a vitamin D level to make sure that was not playing a role with her fatigue.  Since I saw the patient she was able to get her testing done.  She is here today to discuss her results.  She also tells me that she feels she has been having some intermittent sharp chest pain.  It comes and go.  Nothing makes it better.  She is concerned about this.   Past Medical History:  Diagnosis Date   Anemia    Anxiety    Asthma    Heart murmur    Hypertension    Restless leg syndrome     Past Surgical History:  Procedure Laterality Date   KNEE ARTHROSCOPY W/ ACL RECONSTRUCTION Left    LEEP  1999   x 2   TONSILLECTOMY      Current Medications: Current Meds  Medication Sig   ALPRAZolam (XANAX) 0.5 MG tablet Take 0.5 mg by mouth 3 (three) times daily as needed for anxiety.   gabapentin (NEURONTIN) 100 MG capsule Take 100 mg by mouth daily as needed (restless legs).   Multiple Vitamin (MULTI-VITAMIN DAILY PO) Take 1 tablet by mouth as needed.   propranolol (INDERAL) 10 MG tablet Take 1 tablet (10 mg total) by mouth at bedtime.   rOPINIRole (REQUIP XL) 8 MG 24 hr tablet TK 1 T PO QD    vitamin C (ASCORBIC ACID) 500 MG tablet Take 500 mg by mouth as needed.   VITAMIN D PO Take 1 tablet by mouth as needed.   Zinc 50 MG TABS Take 1 tablet by mouth as needed.     Allergies:   Patient has no known allergies.   Social History   Socioeconomic History   Marital status: Divorced    Spouse name: Not on file   Number of children: 0   Years of education: Not on file   Highest education level: Not on file  Occupational History   Occupation: client service rep  Tobacco Use   Smoking status: Never   Smokeless tobacco: Never  Vaping Use   Vaping Use: Never used  Substance and Sexual Activity   Alcohol use: Yes    Comment: occ   Drug use: No   Sexual activity: Yes    Birth control/protection: None  Other Topics Concern   Not on file  Social History Narrative   Not on file   Social Determinants of Health   Financial Resource Strain: Not on file  Food Insecurity: Not on file  Transportation Needs: Not on file  Physical Activity: Not on file  Stress: Not on file  Social Connections: Not on file     Family History: The patient's family history includes COPD in her father; Diabetes in her mother; Hypertension in her mother; Kidney disease in her mother.  ROS:   Review of Systems  Constitution: Negative for decreased appetite, fever and weight gain.  HENT: Negative for congestion, ear discharge, hoarse voice and sore throat.   Eyes: Negative for discharge, redness, vision loss in right eye and visual halos.  Cardiovascular: Reports chest pain.negative for dyspnea on exertion, leg swelling, orthopnea and palpitations.  Respiratory: Negative for cough, hemoptysis, shortness of breath and snoring.   Endocrine: Negative for heat intolerance and polyphagia.  Hematologic/Lymphatic: Negative for bleeding problem. Does not bruise/bleed easily.  Skin: Negative for flushing, nail changes, rash and suspicious lesions.  Musculoskeletal: Negative for arthritis, joint pain,  muscle cramps, myalgias, neck pain and stiffness.  Gastrointestinal: Negative for abdominal pain, bowel incontinence, diarrhea and excessive appetite.  Genitourinary: Negative for decreased libido, genital sores and incomplete emptying.  Neurological: Negative for brief paralysis, focal weakness, headaches and loss of balance.  Psychiatric/Behavioral: Negative for altered mental status, depression and suicidal ideas.  Allergic/Immunologic: Negative for HIV exposure and persistent infections.    EKGs/Labs/Other Studies Reviewed:    The following studies were reviewed today:   EKG: None today   Transthoracic echocardiogram August 23, 2020 IMPRESSIONS     1. Left ventricular ejection fraction, by estimation, is 60 to 65%. The  left ventricle has normal function. The left ventricle has no regional  wall motion abnormalities. Left ventricular diastolic parameters were  normal.   2. Right ventricular systolic function is normal. The right ventricular  size is normal.   3. The mitral valve is normal in structure. No evidence of mitral valve  regurgitation. No evidence of mitral stenosis.   4. The aortic valve is normal in structure. Aortic valve regurgitation is  not visualized. No aortic stenosis is present.   5. The inferior vena cava is normal in size with greater than 50%  respiratory variability, suggesting right atrial pressure of 3 mmHg.   FINDINGS   Left Ventricle: Left ventricular ejection fraction, by estimation, is 60  to 65%. The left ventricle has normal function. The left ventricle has no  regional wall motion abnormalities. The left ventricular internal cavity  size was normal in size. There is   no left ventricular hypertrophy. Left ventricular diastolic parameters  were normal.   Right Ventricle: The right ventricular size is normal. No increase in  right ventricular wall thickness. Right ventricular systolic function is  normal.   Left Atrium: Left atrial size  was normal in size.   Right Atrium: Right atrial size was normal in size.   Pericardium: There is no evidence of pericardial effusion.   Mitral Valve: The mitral valve is normal in structure. No evidence of  mitral valve regurgitation. No evidence of mitral valve stenosis.   Tricuspid Valve: The tricuspid valve is normal in structure. Tricuspid  valve regurgitation is not demonstrated. No evidence of tricuspid  stenosis.   Aortic Valve: The aortic valve is normal in structure. Aortic valve  regurgitation is not visualized. No aortic stenosis is present. Aortic  valve mean gradient measures 5.0 mmHg. Aortic valve peak gradient measures  11.7 mmHg.   Pulmonic Valve: The pulmonic valve was normal in structure. Pulmonic valve  regurgitation is not visualized. No evidence of pulmonic stenosis.   Aorta: The aortic root is normal in size and structure.   Venous: The inferior  vena cava is normal in size with greater than 50%  respiratory variability, suggesting right atrial pressure of 3 mmHg.   IAS/Shunts: No atrial level shunt detected by color flow Doppler.   Patch Wear Time:  9 days and 21 hours starting August 23, 2020. Indication: Palpitations   Patient had a min HR of 41 bpm, max HR of 174 bpm, and avg HR of 76 bpm.   Predominant underlying rhythm was Sinus Rhythm.   6 Supraventricular Tachycardia runs occurred, the run with the fastest interval lasting 4 beats with a max rate of 174 bpm, the longest lasting 12 beats with an avg rate of 118 bpm.   Premature atrial complexes were rare. Premature ventricular complexes were rare.   Symptoms associated with sinus rhythm and rare premature ventricular complex.   No atrial fibrillation, no ventricular tachycardia noted.   Conclusion- This study is remarkable for the following:                       1.  Paroxysmal supraventricular tachycardia.  Recent Labs: 07/15/2020: BUN 15; Creatinine, Ser 0.75; Hemoglobin 13.5; Magnesium  1.8; Platelets 261; Potassium 3.5; Sodium 138; TSH 2.827  Recent Lipid Panel No results found for: CHOL, TRIG, HDL, CHOLHDL, VLDL, LDLCALC, LDLDIRECT  Physical Exam:    VS:  BP 118/78 (BP Location: Left Arm, Patient Position: Sitting, Cuff Size: Normal)   Pulse 67   Resp 20   Ht 5\' 4"  (1.626 m)   Wt 138 lb 9.6 oz (62.9 kg)   LMP 03/01/2016 (Exact Date)   SpO2 98%   BMI 23.79 kg/m     Wt Readings from Last 3 Encounters:  12/13/20 138 lb 9.6 oz (62.9 kg)  08/18/20 137 lb (62.1 kg)  07/15/20 138 lb (62.6 kg)     GEN: Well nourished, well developed in no acute distress HEENT: Normal NECK: No JVD; No carotid bruits LYMPHATICS: No lymphadenopathy CARDIAC: S1S2 noted,RRR, no murmurs, rubs, gallops RESPIRATORY:  Clear to auscultation without rales, wheezing or rhonchi  ABDOMEN: Soft, non-tender, non-distended, +bowel sounds, no guarding. EXTREMITIES: No edema, No cyanosis, no clubbing MUSCULOSKELETAL:  No deformity  SKIN: Warm and dry NEUROLOGIC:  Alert and oriented x 3, non-focal PSYCHIATRIC:  Normal affect, good insight  ASSESSMENT:    1. PAT (paroxysmal atrial tachycardia) (HCC)   2. Atypical chest pain    PLAN:     We discussed her monitor results which show evidence of paroxysmal atrial tachycardia.  We will going to start the patient on low-dose propanolol 10 mg at nighttime.  Educated patient about this medicine.  She will update me in 2 weeks if there are any improvement. I am suspecting that her chest discomfort is associated with her atrial tachycardia.  If she does not have any improvement we will try to optimize the beta-blocker.  And if no symptomatic relief we will plan to consider ischemic evaluation with likely coronary CTA.   The patient is in agreement with the above plan. The patient left the office in stable condition.  The patient will follow up in 1 year or sooner if needed.   Medication Adjustments/Labs and Tests Ordered: Current medicines are  reviewed at length with the patient today.  Concerns regarding medicines are outlined above.  No orders of the defined types were placed in this encounter.  Meds ordered this encounter  Medications   propranolol (INDERAL) 10 MG tablet    Sig: Take 1 tablet (10 mg total) by mouth  at bedtime.    Dispense:  90 tablet    Refill:  3    Patient Instructions  Medication Instructions:  Your physician has recommended you make the following change in your medication:  START: Propranolol 10 mg at night *If you need a refill on your cardiac medications before your next appointment, please call your pharmacy*   Lab Work: None If you have labs (blood work) drawn today and your tests are completely normal, you will receive your results only by: MyChart Message (if you have MyChart) OR A paper copy in the mail If you have any lab test that is abnormal or we need to change your treatment, we will call you to review the results.   Testing/Procedures: None   Follow-Up: At [facility], you and your health needs are our priority.  As part of our continuing mission to provide you with exceptional heart care, we have created designated Provider Care Teams.  These Care Teams include your primary Cardiologist (physician) and Advanced Practice Providers (APPs -  Physician Assistants and Nurse Practitioners) who all work together to provide you with the care you need, when you need it.  We recommend signing up for the patient portal called "MyChart".  Sign up information is provided on this After Visit Summary.  MyChart is used to connect with patients for Virtual Visits (Telemedicine).  Patients are able to view lab/test results, encounter notes, upcoming appointments, etc.  Non-urgent messages can be sent to your provider as well.   To learn more about what you can do with MyChart, go to [email or web address].    Your next appointment:   1 year(s)  The format for your next appointment:   In  Person  Provider:   Thomasene Ripple, DO     Other Instructions  Please send [place] an update after 2 weeks.    Adopting a Healthy Lifestyle.  Know what a healthy weight is for you (roughly BMI <25) and aim to maintain this   Aim for 7+ servings of fruits and vegetables daily   65-80+ fluid ounces of water or unsweet tea for healthy kidneys   Limit to max 1 drink of alcohol per day; avoid smoking/tobacco   Limit animal fats in diet for cholesterol and heart health - choose grass fed whenever available   Avoid highly processed foods, and foods high in saturated/trans fats   Aim for low stress - take time to unwind and care for your mental health   Aim for 150 min of moderate intensity exercise weekly for heart health, and weights twice weekly for bone health   Aim for 7-9 hours of sleep daily   When it comes to diets, agreement about the perfect plan isnt easy to find, even among the experts. Experts at the [facility] [employer] developed an idea known as the Healthy Eating Plate. Just imagine a plate divided into logical, healthy portions.   The emphasis is on diet quality:   Load up on vegetables and fruits - one-half of your plate: Aim for color and variety, and remember that potatoes dont count.   Go for whole grains - one-quarter of your plate: Whole wheat, barley, wheat berries, quinoa, oats, brown rice, and foods made with them. If you want pasta, go with whole wheat pasta.   Protein power - one-quarter of your plate: Fish, chicken, beans, and nuts are all healthy, versatile protein sources. Limit red meat.   The diet, however, does go beyond the plate, offering  a few other suggestions.   Use healthy plant oils, such as olive, canola, soy, corn, sunflower and peanut. Check the labels, and avoid partially hydrogenated oil, which have unhealthy trans fats.   If youre thirsty, drink water. Coffee and tea are good in moderation, but skip sugary drinks and limit milk  and dairy products to one or two daily servings.   The type of carbohydrate in the diet is more important than the amount. Some sources of carbohydrates, such as vegetables, fruits, whole grains, and beans-are healthier than others.   Finally, stay active  Signed, Thomasene Ripple, DO  12/13/2020 2:33 PM    [facility]

## 2020-12-22 ENCOUNTER — Encounter: Payer: Self-pay | Admitting: Cardiology

## 2020-12-27 ENCOUNTER — Other Ambulatory Visit: Payer: Self-pay

## 2020-12-27 MED ORDER — PROPRANOLOL HCL 10 MG PO TABS: 10.0000 mg | ORAL_TABLET | Freq: Two times a day (BID) | ORAL | 3 refills | Status: DC

## 2021-01-13 DIAGNOSIS — R195 Other fecal abnormalities: Secondary | ICD-10-CM | POA: Diagnosis not present

## 2021-01-14 DIAGNOSIS — R195 Other fecal abnormalities: Secondary | ICD-10-CM | POA: Diagnosis not present

## 2021-03-28 DIAGNOSIS — M25561 Pain in right knee: Secondary | ICD-10-CM | POA: Diagnosis not present

## 2021-03-28 DIAGNOSIS — M25461 Effusion, right knee: Secondary | ICD-10-CM | POA: Diagnosis not present

## 2021-06-02 DIAGNOSIS — R350 Frequency of micturition: Secondary | ICD-10-CM | POA: Diagnosis not present

## 2021-07-12 ENCOUNTER — Encounter: Payer: Self-pay | Admitting: Nurse Practitioner

## 2021-07-12 ENCOUNTER — Ambulatory Visit: Payer: 59 | Admitting: Nurse Practitioner

## 2021-07-12 VITALS — BP 120/78 | HR 69 | Temp 96.8°F | Ht 64.0 in | Wt 134.2 lb

## 2021-07-12 DIAGNOSIS — Z780 Asymptomatic menopausal state: Secondary | ICD-10-CM | POA: Diagnosis not present

## 2021-07-12 DIAGNOSIS — I471 Supraventricular tachycardia: Secondary | ICD-10-CM | POA: Diagnosis not present

## 2021-07-12 DIAGNOSIS — J4520 Mild intermittent asthma, uncomplicated: Secondary | ICD-10-CM | POA: Diagnosis not present

## 2021-07-12 DIAGNOSIS — R69 Illness, unspecified: Secondary | ICD-10-CM | POA: Diagnosis not present

## 2021-07-12 DIAGNOSIS — G2581 Restless legs syndrome: Secondary | ICD-10-CM

## 2021-07-12 DIAGNOSIS — F419 Anxiety disorder, unspecified: Secondary | ICD-10-CM

## 2021-07-12 MED ORDER — GABAPENTIN 100 MG PO CAPS: 100.0000 mg | ORAL_CAPSULE | Freq: Every day | ORAL | 1 refills | Status: AC

## 2021-07-12 MED ORDER — ALBUTEROL SULFATE HFA 108 (90 BASE) MCG/ACT IN AERS: 2.0000 | INHALATION_SPRAY | Freq: Four times a day (QID) | RESPIRATORY_TRACT | 0 refills | Status: AC | PRN

## 2021-07-12 NOTE — Progress Notes (Signed)
New Patient Office Visit  Subjective    Patient ID: Lindsay Butler, female    DOB: December 25, 1965  Age: 56 y.o. MRN: [id number]  CC:  Chief Complaint  Patient presents with   Establish Care    Np. Est care. Overall health assessment. Pt has concerns with post-menopausal symptoms     HPI Lindsay Butler presents for new patient visit to establish care.  Introduced to Publishing rights manager role and practice setting.  All questions answered.  Discussed provider/patient relationship and expectations.  She has a history of paroxysmal atrial tachycardia and is taking propanolol 10 mg daily.  She denies chest pain, shortness of breath, palpitations.  She sees cardiology as needed.  She has a history of restless leg syndrome and is taking ropinirole 8 mg and gabapentin 100 mg at bedtime.  This is helping to control her symptoms.  She is postmenopausal, she states that her hot flashes have resolved several years ago.  She has heard of women using estrogen cream on her face to help with sagging skin and is interested in possibly starting this.  She has a history of anxiety and currently takes alprazolam 0.5 mg as needed.  She states that a 30-day supply will last her a year.  Overall her anxiety is well controlled.     07/12/2021   11:48 AM 09/04/2017    9:26 AM 08/27/2017    9:27 AM 02/16/2016    3:58 PM  Depression screen PHQ 2/9  Decreased Interest 0 0 0 0  Down, Depressed, Hopeless 0 0 0 0  PHQ - 2 Score 0 0 0 0  Altered sleeping 2   3  Tired, decreased energy 0   0  Change in appetite 0   0  Feeling bad or failure about yourself  1   1  Trouble concentrating 0   0  Moving slowly or fidgety/restless 0   0  Suicidal thoughts 0   0  PHQ-9 Score 3   4  Difficult doing work/chores Somewhat difficult         07/12/2021   11:49 AM 02/16/2016    4:00 PM  GAD 7 : Generalized Anxiety Score  Nervous, Anxious, on Edge 0 0  Control/stop worrying 0 0  Worry too much - different things 0 2  Trouble  relaxing 0 1  Restless 0 1  Easily annoyed or irritable 0 0  Afraid - awful might happen 0 1  Total GAD 7 Score 0 5    Outpatient Encounter Medications as of 07/12/2021  Medication Sig   albuterol (VENTOLIN HFA) 108 (90 Base) MCG/ACT inhaler Inhale 2 puffs into the lungs every 6 (six) hours as needed for wheezing or shortness of breath.   propranolol (INDERAL) 10 MG tablet Take 1 tablet (10 mg total) by mouth 2 (two) times daily.   rOPINIRole (REQUIP XL) 8 MG 24 hr tablet TK 1 T PO QD   [DISCONTINUED] gabapentin (NEURONTIN) 100 MG capsule Take 100 mg by mouth daily as needed (restless legs).   ALPRAZolam (XANAX) 0.5 MG tablet Take 0.5 mg by mouth 3 (three) times daily as needed for anxiety.   gabapentin (NEURONTIN) 100 MG capsule Take 1 capsule (100 mg total) by mouth at bedtime.   [DISCONTINUED] Multiple Vitamin (MULTI-VITAMIN DAILY PO) Take 1 tablet by mouth as needed.   [DISCONTINUED] vitamin C (ASCORBIC ACID) 500 MG tablet Take 500 mg by mouth as needed.   [DISCONTINUED] VITAMIN D PO Take 1 tablet by mouth as  needed.   [DISCONTINUED] Zinc 50 MG TABS Take 1 tablet by mouth as needed.   No facility-administered encounter medications on file as of 07/12/2021.    Past Medical History:  Diagnosis Date   Anemia    Anxiety    Arthritis 07/16/2020   Mostly in neck and knees   Asthma    Heart murmur    Hypertension    Restless leg syndrome     Past Surgical History:  Procedure Laterality Date   KNEE ARTHROSCOPY W/ ACL RECONSTRUCTION Left    LEEP  1999   x 2   TONSILLECTOMY      Family History  Problem Relation Age of Onset   Diabetes Mother    Hypertension Mother    Kidney disease Mother    COPD Father     Social History   Socioeconomic History   Marital status: Divorced    Spouse name: Not on file   Number of children: 0   Years of education: Not on file   Highest education level: Not on file  Occupational History   Occupation: client service rep  Tobacco Use    Smoking status: Never   Smokeless tobacco: Never  Vaping Use   Vaping Use: Never used  Substance and Sexual Activity   Alcohol use: Yes    Alcohol/week: 1.0 standard drink of alcohol    Types: 1 Standard drinks or equivalent per week    Comment: occ   Drug use: No   Sexual activity: Yes    Birth control/protection: Post-menopausal, None  Other Topics Concern   Not on file  Social History Narrative   Not on file   Social Determinants of Health   Financial Resource Strain: Not on file  Food Insecurity: Not on file  Transportation Needs: Not on file  Physical Activity: Not on file  Stress: Not on file  Social Connections: Not on file  Intimate Partner Violence: Not on file    Review of Systems  Constitutional: Negative.   HENT: Negative.    Respiratory: Negative.    Cardiovascular: Negative.   Gastrointestinal: Negative.   Genitourinary: Negative.   Musculoskeletal: Negative.   Skin: Negative.   Neurological: Negative.   Psychiatric/Behavioral: Negative.        Objective    BP 120/78   Pulse 69   Temp (!) 96.8 F (36 C) (Temporal)   Ht 5\' 4"  (1.626 m)   Wt 134 lb 3.2 oz (60.9 kg)   LMP 03/01/2016 (Exact Date)   SpO2 98%   BMI 23.04 kg/m   Physical Exam Vitals and nursing note reviewed.  Constitutional:      General: She is not in acute distress.    Appearance: Normal appearance.  HENT:     Head: Normocephalic.  Eyes:     Conjunctiva/sclera: Conjunctivae normal.  Cardiovascular:     Rate and Rhythm: Normal rate and regular rhythm.     Pulses: Normal pulses.     Heart sounds: Murmur heard.  Pulmonary:     Effort: Pulmonary effort is normal.     Breath sounds: Normal breath sounds.  Musculoskeletal:     Cervical back: Normal range of motion.  Skin:    General: Skin is warm.  Neurological:     General: No focal deficit present.     Mental Status: She is alert and oriented to person, place, and time.  Psychiatric:        Mood and Affect: Mood  normal.  Behavior: Behavior normal.        Thought Content: Thought content normal.        Judgment: Judgment normal.      Assessment & Plan:   Problem List Items Addressed This Visit       Cardiovascular and Mediastinum   PAT (paroxysmal atrial tachycardia) (HCC)    Chronic, stable.  Well-controlled with propanolol 10 mg daily.  She follows with cardiology as needed.  Follow-up with any concerns.        Respiratory   Asthma    Chronic, stable.  She only uses an albuterol inhaler as needed and this is every few months.  We will send in a refill of her albuterol inhaler.  Follow-up if symptoms worsen or any concerns.      Relevant Medications   albuterol (VENTOLIN HFA) 108 (90 Base) MCG/ACT inhaler     Other   Anxiety    Chronic, stable.  She takes alprazolam 0.5 mg as needed.  She states that she takes this only a couple of times a month.  Her PHQ-9 is a 3 and her GAD-7 is a 0.  She denies SI/HI.  Follow-up with any concerns.      Restless leg syndrome    Chronic, stable.  She is currently taking ropinirole 8 mg daily and gabapentin 100 mg daily at bedtime.  We will continue this regimen.      Post-menopausal - Primary    She is currently postmenopausal and her hot flashes have resolved years ago.  She denies vaginal dryness.  She has noticed some loose and sagging skin around her face and her neck.  She has heard a woman using estrogen cream to help with this.  I will look into this further in the research and medical journals and discuss options next visit.       Return if symptoms worsen or fail to improve.   Gerre Scull, NP

## 2021-07-13 DIAGNOSIS — Z780 Asymptomatic menopausal state: Secondary | ICD-10-CM | POA: Insufficient documentation

## 2021-07-13 NOTE — Assessment & Plan Note (Signed)
Chronic, stable.  Well-controlled with propanolol 10 mg daily.  She follows with cardiology as needed.  Follow-up with any concerns.

## 2021-07-13 NOTE — Assessment & Plan Note (Signed)
She is currently postmenopausal and her hot flashes have resolved years ago.  She denies vaginal dryness.  She has noticed some loose and sagging skin around her face and her neck.  She has heard a woman using estrogen cream to help with this.  I will look into this further in the research and medical journals and discuss options next visit.

## 2021-07-13 NOTE — Assessment & Plan Note (Signed)
Chronic, stable.  She only uses an albuterol inhaler as needed and this is every few months.  We will send in a refill of her albuterol inhaler.  Follow-up if symptoms worsen or any concerns.

## 2021-07-13 NOTE — Assessment & Plan Note (Signed)
Chronic, stable.  She takes alprazolam 0.5 mg as needed.  She states that she takes this only a couple of times a month.  Her PHQ-9 is a 3 and her GAD-7 is a 0.  She denies SI/HI.  Follow-up with any concerns.

## 2021-07-13 NOTE — Assessment & Plan Note (Signed)
Chronic, stable.  She is currently taking ropinirole 8 mg daily and gabapentin 100 mg daily at bedtime.  We will continue this regimen.

## 2021-07-14 ENCOUNTER — Ambulatory Visit (INDEPENDENT_AMBULATORY_CARE_PROVIDER_SITE_OTHER): Payer: 59 | Admitting: Nurse Practitioner

## 2021-07-14 ENCOUNTER — Other Ambulatory Visit (HOSPITAL_COMMUNITY)
Admission: RE | Admit: 2021-07-14 | Discharge: 2021-07-14 | Disposition: A | Discharge status: Home / Self Care | Payer: 59 | Source: Ambulatory Visit | Attending: Nurse Practitioner | Admitting: Nurse Practitioner

## 2021-07-14 ENCOUNTER — Encounter: Payer: Self-pay | Admitting: Nurse Practitioner

## 2021-07-14 VITALS — BP 102/76 | HR 72 | Temp 97.5°F | Ht 64.0 in | Wt 133.8 lb

## 2021-07-14 DIAGNOSIS — Z124 Encounter for screening for malignant neoplasm of cervix: Secondary | ICD-10-CM | POA: Diagnosis not present

## 2021-07-14 DIAGNOSIS — Z0000 Encounter for general adult medical examination without abnormal findings: Secondary | ICD-10-CM

## 2021-07-14 DIAGNOSIS — D649 Anemia, unspecified: Secondary | ICD-10-CM

## 2021-07-14 DIAGNOSIS — R5383 Other fatigue: Secondary | ICD-10-CM

## 2021-07-14 DIAGNOSIS — Z13220 Encounter for screening for lipoid disorders: Secondary | ICD-10-CM

## 2021-07-14 NOTE — Patient Instructions (Signed)
It was great to see you!  We are checking your labs tomorrow and will let you know the results via mychart/phone.   You can start a vitamin B12 supplement to see if it helps with fatigue  Let's follow-up in 1 year, sooner if you have concerns.  If a referral was placed today, you will be contacted for an appointment. Please note that routine referrals can sometimes take up to 3-4 weeks to process. Please call our office if you haven't heard anything after this time frame.  Take care,  Rodman Pickle, NP

## 2021-07-14 NOTE — Assessment & Plan Note (Signed)
She has a history of anemia. Will check CBC and iron panel.

## 2021-07-14 NOTE — Progress Notes (Signed)
BP 102/76   Pulse 72   Temp (!) 97.5 F (36.4 C) (Temporal)   Ht 5\' 4"  (1.626 m)   Wt 133 lb 12.8 oz (60.7 kg)   LMP 03/01/2016 (Exact Date)   SpO2 97%   BMI 22.97 kg/m    Subjective:    Patient ID: Lindsay Butler, female    DOB: September 01, 1965, 56 y.o.   MRN: [id number]  CC: Chief Complaint  Patient presents with   Annual Exam    CPE w/ pap. Pt is not fasting   HPI: Barry Culverhouse is a 56 y.o. female presenting on 07/14/2021 for comprehensive medical examination. Current medical complaints include:none  She currently lives with: roommate Menopausal Symptoms: no  Depression Screen done today and results listed below:     07/12/2021   11:48 AM 09/04/2017    9:26 AM 08/27/2017    9:27 AM 02/16/2016    3:58 PM  Depression screen PHQ 2/9  Decreased Interest 0 0 0 0  Down, Depressed, Hopeless 0 0 0 0  PHQ - 2 Score 0 0 0 0  Altered sleeping 2   3  Tired, decreased energy 0   0  Change in appetite 0   0  Feeling bad or failure about yourself  1   1  Trouble concentrating 0   0  Moving slowly or fidgety/restless 0   0  Suicidal thoughts 0   0  PHQ-9 Score 3   4  Difficult doing work/chores Somewhat difficult       The patient does not have a history of falls. I did not complete a risk assessment for falls. A plan of care for falls was not documented.   Past Medical History:  Past Medical History:  Diagnosis Date   Anemia    Anxiety    Arthritis 07/16/2020   Mostly in neck and knees   Asthma    Heart murmur    Hypertension    Restless leg syndrome     Surgical History:  Past Surgical History:  Procedure Laterality Date   KNEE ARTHROSCOPY W/ ACL RECONSTRUCTION Left    LEEP  1999   x 2   TONSILLECTOMY      Medications:  Current Outpatient Medications on File Prior to Visit  Medication Sig   albuterol (VENTOLIN HFA) 108 (90 Base) MCG/ACT inhaler Inhale 2 puffs into the lungs every 6 (six) hours as needed for wheezing or shortness of breath.   ALPRAZolam (XANAX)  0.5 MG tablet Take 0.5 mg by mouth 3 (three) times daily as needed for anxiety.   gabapentin (NEURONTIN) 100 MG capsule Take 1 capsule (100 mg total) by mouth at bedtime.   propranolol (INDERAL) 10 MG tablet Take 1 tablet (10 mg total) by mouth 2 (two) times daily.   rOPINIRole (REQUIP XL) 8 MG 24 hr tablet TK 1 T PO QD   No current facility-administered medications on file prior to visit.    Allergies:  No Known Allergies  Social History:  Social History   Socioeconomic History   Marital status: Divorced    Spouse name: Not on file   Number of children: 0   Years of education: Not on file   Highest education level: Not on file  Occupational History   Occupation: client service rep  Tobacco Use   Smoking status: Never   Smokeless tobacco: Never  Vaping Use   Vaping Use: Never used  Substance and Sexual Activity   Alcohol use: Yes  Alcohol/week: 1.0 standard drink of alcohol    Types: 1 Standard drinks or equivalent per week    Comment: occ   Drug use: No   Sexual activity: Yes    Birth control/protection: Post-menopausal, None  Other Topics Concern   Not on file  Social History Narrative   Not on file   Social Determinants of Health   Financial Resource Strain: Not on file  Food Insecurity: Not on file  Transportation Needs: Not on file  Physical Activity: Not on file  Stress: Not on file  Social Connections: Not on file  Intimate Partner Violence: Not on file   Social History   Tobacco Use  Smoking Status Never  Smokeless Tobacco Never   Social History   Substance and Sexual Activity  Alcohol Use Yes   Alcohol/week: 1.0 standard drink of alcohol   Types: 1 Standard drinks or equivalent per week   Comment: occ    Family History:  Family History  Problem Relation Age of Onset   Diabetes Mother    Hypertension Mother    Kidney disease Mother    COPD Father     Past medical history, surgical history, medications, allergies, family history and  social history reviewed with patient today and changes made to appropriate areas of the chart.   Review of Systems  Constitutional:  Positive for malaise/fatigue. Negative for fever.  HENT: Negative.    Eyes: Negative.   Respiratory: Negative.    Cardiovascular: Negative.   Gastrointestinal: Negative.   Genitourinary: Negative.   Musculoskeletal: Negative.   Skin: Negative.   Neurological: Negative.   Psychiatric/Behavioral: Negative.     All other ROS negative except what is listed above and in the HPI.      Objective:    BP 102/76   Pulse 72   Temp (!) 97.5 F (36.4 C) (Temporal)   Ht 5\' 4"  (1.626 m)   Wt 133 lb 12.8 oz (60.7 kg)   LMP 03/01/2016 (Exact Date)   SpO2 97%   BMI 22.97 kg/m   Wt Readings from Last 3 Encounters:  07/14/21 133 lb 12.8 oz (60.7 kg)  07/12/21 134 lb 3.2 oz (60.9 kg)  12/13/20 138 lb 9.6 oz (62.9 kg)    Physical Exam Vitals and nursing note reviewed. Exam conducted with a chaperone present.  Constitutional:      General: She is not in acute distress.    Appearance: Normal appearance.  HENT:     Head: Normocephalic and atraumatic.     Right Ear: Tympanic membrane, ear canal and external ear normal.     Left Ear: Tympanic membrane, ear canal and external ear normal.  Eyes:     Conjunctiva/sclera: Conjunctivae normal.  Cardiovascular:     Rate and Rhythm: Normal rate and regular rhythm.     Pulses: Normal pulses.     Heart sounds: Normal heart sounds.  Pulmonary:     Effort: Pulmonary effort is normal.     Breath sounds: Normal breath sounds.  Chest:  Breasts:    Right: Normal.     Left: Normal.  Abdominal:     Palpations: Abdomen is soft.     Tenderness: There is no abdominal tenderness.  Genitourinary:    General: Normal vulva.     Labia:        Right: No rash, tenderness or lesion.        Left: No rash, tenderness or lesion.      Vagina: Normal.  Uterus: Normal.      Adnexa: Right adnexa normal and left adnexa normal.      Comments: Did not tolerate speculum  Musculoskeletal:        General: Normal range of motion.     Cervical back: Normal range of motion and neck supple. No tenderness.     Right lower leg: No edema.     Left lower leg: No edema.  Lymphadenopathy:     Cervical: No cervical adenopathy.     Upper Body:     Right upper body: No supraclavicular, axillary or pectoral adenopathy.     Left upper body: No supraclavicular, axillary or pectoral adenopathy.  Skin:    General: Skin is warm and dry.  Neurological:     General: No focal deficit present.     Mental Status: She is alert and oriented to person, place, and time.     Cranial Nerves: No cranial nerve deficit.     Coordination: Coordination normal.     Gait: Gait normal.  Psychiatric:        Mood and Affect: Mood normal.        Behavior: Behavior normal.        Thought Content: Thought content normal.        Judgment: Judgment normal.     Results for orders placed or performed during the hospital encounter of 08/23/20  ECHOCARDIOGRAM COMPLETE  Result Value Ref Range   S' Lateral 2.31 cm   Area-P 1/2 3.00 cm2   Ao pk vel 1.71 m/s   AV Peak grad 11.7 mmHg   AV Mean grad 5.0 mmHg   Single Plane A2C EF 70.6 %   Single Plane A4C EF 58.1 %   Calc EF 64.7 %      Assessment & Plan:   Problem List Items Addressed This Visit       Other   Anemia    She has a history of anemia. Will check CBC and iron panel.       Relevant Orders   CBC with Differential/Platelet   Iron, TIBC and Ferritin Panel   Other Visit Diagnoses     Routine general medical examination at a health care facility    -  Primary   Health maintenance reviewed and updated. Discussed nutrition and exercise. Check CMP, CBC. F/U 1 year   Relevant Orders   CBC with Differential/Platelet   Comprehensive metabolic panel   Screening, lipid       screen lipid panel   Relevant Orders   Lipid panel   Fatigue, unspecified type       Will check CBC and TSH  today. She can take a vitamin B supplement to help with fatigue.    Relevant Orders   CBC with Differential/Platelet   TSH   Screening for cervical cancer       Pap done today. Unable to tolerate smallest speculum. May need referral to GYN.    Relevant Orders   Cytology - PAP        Follow up plan: Return in about 1 year (around 07/15/2022) for CPE.   LABORATORY TESTING:  - Pap smear: pap done  IMMUNIZATIONS:   - Tdap: Tetanus vaccination status reviewed: last tetanus booster within 10 years. - Influenza: Postponed to flu season - Pneumovax: Not applicable - Prevnar: Not applicable - HPV: Not applicable - Zostavax vaccine: Refused  SCREENING: -Mammogram: Up to date  - Colonoscopy: Up to date  - Bone Density: Not applicable  -  Hearing Test: Not applicable  -Spirometry: Not applicable   PATIENT COUNSELING:   Advised to take 1 mg of folate supplement per day if capable of pregnancy.   Sexuality: Discussed sexually transmitted diseases, partner selection, use of condoms, avoidance of unintended pregnancy  and contraceptive alternatives.   Advised to avoid cigarette smoking.  I discussed with the patient that most people either abstain from alcohol or drink within safe limits (<=14/week and <=4 drinks/occasion for males, <=7/weeks and <= 3 drinks/occasion for females) and that the risk for alcohol disorders and other health effects rises proportionally with the number of drinks per week and how often a drinker exceeds daily limits.  Discussed cessation/primary prevention of drug use and availability of treatment for abuse.   Diet: Encouraged to adjust caloric intake to maintain  or achieve ideal body weight, to reduce intake of dietary saturated fat and total fat, to limit sodium intake by avoiding high sodium foods and not adding table salt, and to maintain adequate dietary potassium and calcium preferably from fresh fruits, vegetables, and low-fat dairy products.    stressed  the importance of regular exercise  Injury prevention: Discussed safety belts, safety helmets, smoke detector, smoking near bedding or upholstery.   Dental health: Discussed importance of regular tooth brushing, flossing, and dental visits.    NEXT PREVENTATIVE PHYSICAL DUE IN 1 YEAR. Return in about 1 year (around 07/15/2022) for CPE.

## 2021-07-15 ENCOUNTER — Other Ambulatory Visit (INDEPENDENT_AMBULATORY_CARE_PROVIDER_SITE_OTHER): Payer: 59

## 2021-07-15 DIAGNOSIS — D649 Anemia, unspecified: Secondary | ICD-10-CM

## 2021-07-15 DIAGNOSIS — R5383 Other fatigue: Secondary | ICD-10-CM

## 2021-07-15 DIAGNOSIS — Z0000 Encounter for general adult medical examination without abnormal findings: Secondary | ICD-10-CM | POA: Diagnosis not present

## 2021-07-15 DIAGNOSIS — Z13220 Encounter for screening for lipoid disorders: Secondary | ICD-10-CM | POA: Diagnosis not present

## 2021-07-15 LAB — CBC WITH DIFFERENTIAL/PLATELET
Basophils Absolute: 0 10*3/uL (ref 0.0–0.1)
Basophils Relative: 0.5 % (ref 0.0–3.0)
Eosinophils Absolute: 0.1 10*3/uL (ref 0.0–0.7)
Eosinophils Relative: 1.4 % (ref 0.0–5.0)
HCT: 39.2 % (ref 36.0–46.0)
Hemoglobin: 13 g/dL (ref 12.0–15.0)
Lymphocytes Relative: 30.5 % (ref 12.0–46.0)
Lymphs Abs: 1.6 10*3/uL (ref 0.7–4.0)
MCHC: 33.3 g/dL (ref 30.0–36.0)
MCV: 92.6 fl (ref 78.0–100.0)
Monocytes Absolute: 0.4 10*3/uL (ref 0.1–1.0)
Monocytes Relative: 8.1 % (ref 3.0–12.0)
Neutro Abs: 3.2 10*3/uL (ref 1.4–7.7)
Neutrophils Relative %: 59.5 % (ref 43.0–77.0)
Platelets: 211 10*3/uL (ref 150.0–400.0)
RBC: 4.23 Mil/uL (ref 3.87–5.11)
RDW: 12.5 % (ref 11.5–15.5)
WBC: 5.4 10*3/uL (ref 4.0–10.5)

## 2021-07-15 LAB — LIPID PANEL
Cholesterol: 183 mg/dL (ref 0–200)
HDL: 62 mg/dL (ref >39.00)
LDL Cholesterol: 104 mg/dL — ABNORMAL HIGH (ref 0–99)
NonHDL: 121.27
Total CHOL/HDL Ratio: 3
Triglycerides: 88 mg/dL (ref 0.0–149.0)
VLDL: 17.6 mg/dL (ref 0.0–40.0)

## 2021-07-15 LAB — COMPREHENSIVE METABOLIC PANEL WITH GFR
ALT: 12 U/L (ref 0–35)
AST: 19 U/L (ref 0–37)
Albumin: 4.3 g/dL (ref 3.5–5.2)
Alkaline Phosphatase: 84 U/L (ref 39–117)
BUN: 14 mg/dL (ref 6–23)
CO2: 30 meq/L (ref 19–32)
Calcium: 9.4 mg/dL (ref 8.4–10.5)
Chloride: 104 meq/L (ref 96–112)
Creatinine, Ser: 0.92 mg/dL (ref 0.40–1.20)
GFR: 69.58 mL/min
Glucose, Bld: 88 mg/dL (ref 70–99)
Potassium: 4.5 meq/L (ref 3.5–5.1)
Sodium: 140 meq/L (ref 135–145)
Total Bilirubin: 1.5 mg/dL — ABNORMAL HIGH (ref 0.2–1.2)
Total Protein: 6.9 g/dL (ref 6.0–8.3)

## 2021-07-15 LAB — TSH: TSH: 0.83 u[IU]/mL (ref 0.35–5.50)

## 2021-07-16 LAB — IRON,TIBC AND FERRITIN PANEL
%SAT: 26 % (calc) (ref 16–45)
Ferritin: 44 ng/mL (ref 16–232)
Iron: 82 ug/dL (ref 45–160)
TIBC: 315 mcg/dL (calc) (ref 250–450)

## 2021-07-18 LAB — CYTOLOGY - PAP
Comment: NEGATIVE
Diagnosis: NEGATIVE
High risk HPV: NEGATIVE

## 2021-12-21 DIAGNOSIS — H10412 Chronic giant papillary conjunctivitis, left eye: Secondary | ICD-10-CM | POA: Diagnosis not present

## 2022-01-30 DIAGNOSIS — H10411 Chronic giant papillary conjunctivitis, right eye: Secondary | ICD-10-CM | POA: Diagnosis not present

## 2022-02-01 ENCOUNTER — Other Ambulatory Visit: Payer: Self-pay | Admitting: Cardiology

## 2022-02-08 ENCOUNTER — Encounter: Payer: Self-pay | Admitting: Nurse Practitioner

## 2022-02-08 ENCOUNTER — Telehealth (INDEPENDENT_AMBULATORY_CARE_PROVIDER_SITE_OTHER): Payer: BC Managed Care – PPO | Admitting: Nurse Practitioner

## 2022-02-08 VITALS — Ht 64.0 in | Wt 139.0 lb

## 2022-02-08 DIAGNOSIS — G2581 Restless legs syndrome: Secondary | ICD-10-CM

## 2022-02-08 DIAGNOSIS — N951 Menopausal and female climacteric states: Secondary | ICD-10-CM | POA: Diagnosis not present

## 2022-02-08 MED ORDER — COMBIPATCH 0.05-0.14 MG/DAY TD PTTW: 1.0000 | MEDICATED_PATCH | TRANSDERMAL | 1 refills | Status: DC

## 2022-02-08 MED ORDER — ROPINIROLE HCL ER 8 MG PO TB24: 8.0000 mg | ORAL_TABLET | Freq: Every day | ORAL | 1 refills | Status: DC

## 2022-02-08 NOTE — Progress Notes (Unsigned)
[facility] [address] [place] [address] Dept: [phone] Dept Fax: [phone]  Virtual Video Visit  I connected with Lindsay Butler on 02/09/22 at  4:20 PM EST by a video enabled telemedicine application and verified that I am speaking with the correct person using two identifiers.  Location patient: Home Location provider: Clinic Persons participating in the virtual visit: Patient; Vance Peper, NP; Mardelle Matte, CMA  I discussed the limitations of evaluation and management by telemedicine and the availability of in person appointments. The patient expressed understanding and agreed to proceed.  Chief Complaint  Patient presents with   Acute Visit    Discuss  hormone therapy she was currently not taking any thing now.    SUBJECTIVE:  HPI: Lindsay Butler is a 57 y.o. female who presents with postmenopausal concerns. She has been experiencing fatigue and wrinkles. She denies hot flashes. She does endorse vaginal dryness as well. She denies history of breast cancer, strokes, and blood clots.  She is interested in starting hormone replacement therapy with patches.  Patient Active Problem List   Diagnosis Date Noted   Vaginal dryness, menopausal 02/09/2022   Post-menopausal 07/13/2021   PAT (paroxysmal atrial tachycardia) 12/13/2020   Atypical chest pain 12/13/2020   Anemia 08/05/2020   Anxiety 08/05/2020   Asthma 08/05/2020   Heart murmur 08/05/2020   Hypertension 08/05/2020   Restless leg syndrome 08/05/2020   HGSIL on Pap smear of cervix 02/16/2016    Past Surgical History:  Procedure Laterality Date   KNEE ARTHROSCOPY W/ ACL RECONSTRUCTION Left    LEEP  1999   x 2   TONSILLECTOMY      Family History  Problem Relation Age of Onset   Diabetes Mother    Hypertension Mother    Kidney disease Mother    COPD Father     Social History   Tobacco Use   Smoking status: Never   Smokeless tobacco:  Never  Vaping Use   Vaping Use: Never used  Substance Use Topics   Alcohol use: Yes    Alcohol/week: 1.0 standard drink of alcohol    Types: 1 Standard drinks or equivalent per week    Comment: occ   Drug use: No     Current Outpatient Medications:    estradiol-norethindrone (COMBIPATCH) 0.05-0.14 MG/DAY, Place 1 patch onto the skin 2 (two) times a week., Disp: 8 patch, Rfl: 1   gabapentin (NEURONTIN) 100 MG capsule, Take 1 capsule (100 mg total) by mouth at bedtime., Disp: 90 capsule, Rfl: 1   propranolol (INDERAL) 10 MG tablet, TAKE ONE TABLET BY MOUTH EVERY NIGHT AT BEDTIME, Disp: 30 tablet, Rfl: 1   albuterol (VENTOLIN HFA) 108 (90 Base) MCG/ACT inhaler, Inhale 2 puffs into the lungs every 6 (six) hours as needed for wheezing or shortness of breath. (Patient not taking: Reported on 02/08/2022), Disp: 8 g, Rfl: 0   ALPRAZolam (XANAX) 0.5 MG tablet, Take 0.5 mg by mouth 3 (three) times daily as needed for anxiety. (Patient not taking: Reported on 02/08/2022), Disp: , Rfl:    rOPINIRole (REQUIP XL) 8 MG 24 hr tablet, Take 1 tablet (8 mg total) by mouth at bedtime., Disp: 90 tablet, Rfl: 1  No Known Allergies  ROS: See pertinent positives and negatives per HPI.  OBSERVATIONS/OBJECTIVE:  VITALS per patient if applicable: Today's Vitals   02/08/22 1618  Weight: 139 lb (63 kg)  Height: 5\' 4"  (1.626 m)   Body mass index is 23.86 kg/m.  GENERAL: Alert and oriented. Appears well and in no acute distress.  HEENT: Atraumatic. Conjunctiva clear. No obvious abnormalities on inspection of external nose and ears.  NECK: Normal movements of the head and neck.  LUNGS: On inspection, no signs of respiratory distress. Breathing rate appears normal. No obvious gross SOB, gasping or wheezing, and no conversational dyspnea.  CV: No obvious cyanosis.  MS: Moves all visible extremities without noticeable abnormality.  PSYCH/NEURO: Pleasant and cooperative. No obvious depression or anxiety.  Speech and thought processing grossly intact.  ASSESSMENT AND PLAN:  Problem List Items Addressed This Visit       Genitourinary   Vaginal dryness, menopausal - Primary    Chronic, ongoing.  She has been postmenopausal and is interested in starting hormone replacement therapy to help with this.  She has done some research on her own into replacement options and would be interested in the patch.  Will have her start the CombiPatch twice a week.  She does not have a history of stroke, breast cancer, DVT.  Discussed possible side effects.  Follow-up in 6 to 8 weeks.        Other   Restless leg syndrome    Chronic, stable.  Continue Requip XL 8 mg daily at bedtime.  Refill sent to the pharmacy.        I discussed the assessment and treatment plan with the patient. The patient was provided an opportunity to ask questions and all were answered. The patient agreed with the plan and demonstrated an understanding of the instructions.   The patient was advised to call back or seek an in-person evaluation if the symptoms worsen or if the condition fails to improve as anticipated.   Charyl Dancer, NP

## 2022-02-09 ENCOUNTER — Encounter: Payer: Self-pay | Admitting: Nurse Practitioner

## 2022-02-09 DIAGNOSIS — N951 Menopausal and female climacteric states: Secondary | ICD-10-CM | POA: Insufficient documentation

## 2022-02-09 NOTE — Assessment & Plan Note (Signed)
Chronic, stable.  Continue Requip XL 8 mg daily at bedtime.  Refill sent to the pharmacy.

## 2022-02-09 NOTE — Assessment & Plan Note (Signed)
Chronic, ongoing.  She has been postmenopausal and is interested in starting hormone replacement therapy to help with this.  She has done some research on her own into replacement options and would be interested in the patch.  Will have her start the CombiPatch twice a week.  She does not have a history of stroke, breast cancer, DVT.  Discussed possible side effects.  Follow-up in 6 to 8 weeks.

## 2022-02-09 NOTE — Patient Instructions (Signed)
It was great to see you!  Start the combipatch twice a week for your menopausal symptoms.   Let's follow-up in 6-8 weeks, sooner if you have concerns.  If a referral was placed today, you will be contacted for an appointment. Please note that routine referrals can sometimes take up to 3-4 weeks to process. Please call our office if you haven't heard anything after this time frame.  Take care,  Vance Peper, NP

## 2022-02-20 ENCOUNTER — Encounter: Payer: Self-pay | Admitting: Nurse Practitioner

## 2022-02-22 ENCOUNTER — Telehealth (INDEPENDENT_AMBULATORY_CARE_PROVIDER_SITE_OTHER): Payer: BC Managed Care – PPO | Admitting: Nurse Practitioner

## 2022-02-22 ENCOUNTER — Encounter: Payer: Self-pay | Admitting: Nurse Practitioner

## 2022-02-22 DIAGNOSIS — N951 Menopausal and female climacteric states: Secondary | ICD-10-CM | POA: Diagnosis not present

## 2022-02-22 MED ORDER — ESTRADIOL-NORETHINDRONE ACET 1-0.5 MG PO TABS: 1.0000 | ORAL_TABLET | Freq: Every day | ORAL | 1 refills | Status: DC

## 2022-02-22 NOTE — Progress Notes (Unsigned)
[facility] [address] [place] [address] Dept: [phone] Dept Fax: [phone]  Virtual Video Visit  I connected with Erenest Rasher on 02/23/22 at  3:40 PM EST by a video enabled telemedicine application and verified that I am speaking with the correct person using two identifiers.  Location patient: Home Location provider: Clinic Persons participating in the virtual visit: Patient; Vance Peper, NP; [place], [place] discussed the limitations of evaluation and management by telemedicine and the availability of in person appointments. The patient expressed understanding and agreed to proceed.  Chief Complaint  Patient presents with   Acute Visit    Allergic to Combipatch (HRT patch)-rash and itching    SUBJECTIVE:  HPI: Lindsay Butler is a 57 y.o. female who presents to follow-up on HRT with combipatch twice a week.  She states that she put the patch on and noticed some burning and itching under her skin where the patch was located.  She ended up taking it off and putting it on another spot and it happened again.  She is still interested in hormone replacement therapy, however she does not think the patch will work for her.  After removing the patch, her symptoms have resolved.  Patient Active Problem List   Diagnosis Date Noted   Vaginal dryness, menopausal 02/09/2022   Post-menopausal 07/13/2021   PAT (paroxysmal atrial tachycardia) 12/13/2020   Atypical chest pain 12/13/2020   Anemia 08/05/2020   Anxiety 08/05/2020   Asthma 08/05/2020   Heart murmur 08/05/2020   Hypertension 08/05/2020   Restless leg syndrome 08/05/2020   HGSIL on Pap smear of cervix 02/16/2016    Past Surgical History:  Procedure Laterality Date   KNEE ARTHROSCOPY W/ ACL RECONSTRUCTION Left    LEEP  1999   x 2   TONSILLECTOMY      Family History  Problem Relation Age of Onset   Diabetes Mother    Hypertension Mother    Kidney  disease Mother    COPD Father     Social History   Tobacco Use   Smoking status: Never   Smokeless tobacco: Never  Vaping Use   Vaping Use: Never used  Substance Use Topics   Alcohol use: Yes    Alcohol/week: 1.0 standard drink of alcohol    Types: 1 Standard drinks or equivalent per week    Comment: occ   Drug use: No     Current Outpatient Medications:    estradiol-norethindrone (ACTIVELLA) 1-0.5 MG tablet, Take 1 tablet by mouth daily., Disp: 30 tablet, Rfl: 1   propranolol (INDERAL) 10 MG tablet, TAKE ONE TABLET BY MOUTH EVERY NIGHT AT BEDTIME, Disp: 30 tablet, Rfl: 1   rOPINIRole (REQUIP XL) 8 MG 24 hr tablet, Take 1 tablet (8 mg total) by mouth at bedtime., Disp: 90 tablet, Rfl: 1   albuterol (VENTOLIN HFA) 108 (90 Base) MCG/ACT inhaler, Inhale 2 puffs into the lungs every 6 (six) hours as needed for wheezing or shortness of breath. (Patient not taking: Reported on 02/08/2022), Disp: 8 g, Rfl: 0   ALPRAZolam (XANAX) 0.5 MG tablet, Take 0.5 mg by mouth 3 (three) times daily as needed for anxiety. (Patient not taking: Reported on 02/08/2022), Disp: , Rfl:    gabapentin (NEURONTIN) 100 MG capsule, Take 1 capsule (100 mg total) by mouth at bedtime. (Patient not taking: Reported on 02/22/2022), Disp: 90 capsule, Rfl: 1  No Known Allergies  ROS: See pertinent positives and negatives per HPI.  OBSERVATIONS/OBJECTIVE:  VITALS per patient if applicable: There were no vitals filed for this visit. There is no height or weight on file to calculate BMI.    GENERAL: Alert and oriented. Appears well and in no acute distress.  HEENT: Atraumatic. Conjunctiva clear. No obvious abnormalities on inspection of external nose and ears.  NECK: Normal movements of the head and neck.  LUNGS: On inspection, no signs of respiratory distress. Breathing rate appears normal. No obvious gross SOB, gasping or wheezing, and no conversational dyspnea.  CV: No obvious cyanosis.  MS: Moves all visible  extremities without noticeable abnormality.  PSYCH/NEURO: Pleasant and cooperative. No obvious depression or anxiety. Speech and thought processing grossly intact.  ASSESSMENT AND PLAN:  Problem List Items Addressed This Visit       Genitourinary   Vaginal dryness, menopausal - Primary    She has been experiencing vaginal dryness and fatigue since going through menopause.  She had an allergic reaction to the combipatch, most likely from the adhesive.  She is interested in a systemic HRT.  Will have her start Activella 1 tablet daily.  If she does not tolerate this or just does not help the symptoms, will place referral to specialist.        I discussed the assessment and treatment plan with the patient. The patient was provided an opportunity to ask questions and all were answered. The patient agreed with the plan and demonstrated an understanding of the instructions.   The patient was advised to call back or seek an in-person evaluation if the symptoms worsen or if the condition fails to improve as anticipated.   Charyl Dancer, NP

## 2022-02-22 NOTE — Patient Instructions (Signed)
It was great to see you!  Start activella 1 tablet daily for your menopausal symptoms. Let me know if you have any side effects to this medication.   Let's follow-up in 4-6 weeks, sooner if you have concerns.  If a referral was placed today, you will be contacted for an appointment. Please note that routine referrals can sometimes take up to 3-4 weeks to process. Please call our office if you haven't heard anything after this time frame.  Take care,  Vance Peper, NP

## 2022-02-23 ENCOUNTER — Telehealth: Payer: BC Managed Care – PPO | Admitting: Nurse Practitioner

## 2022-02-23 NOTE — Assessment & Plan Note (Signed)
She has been experiencing vaginal dryness and fatigue since going through menopause.  She had an allergic reaction to the combipatch, most likely from the adhesive.  She is interested in a systemic HRT.  Will have her start Activella 1 tablet daily.  If she does not tolerate this or just does not help the symptoms, will place referral to specialist.

## 2022-03-01 ENCOUNTER — Other Ambulatory Visit: Payer: Self-pay

## 2022-03-01 DIAGNOSIS — Z1231 Encounter for screening mammogram for malignant neoplasm of breast: Secondary | ICD-10-CM

## 2022-03-02 ENCOUNTER — Encounter: Payer: Self-pay | Admitting: Nurse Practitioner

## 2022-03-13 ENCOUNTER — Ambulatory Visit
Admission: RE | Admit: 2022-03-13 | Discharge: 2022-03-13 | Disposition: A | Discharge status: Home / Self Care | Payer: BC Managed Care – PPO | Source: Ambulatory Visit | Attending: Nurse Practitioner | Admitting: Nurse Practitioner

## 2022-03-13 DIAGNOSIS — Z1231 Encounter for screening mammogram for malignant neoplasm of breast: Secondary | ICD-10-CM

## 2022-03-14 DIAGNOSIS — H9319 Tinnitus, unspecified ear: Secondary | ICD-10-CM | POA: Diagnosis not present

## 2022-03-14 DIAGNOSIS — H9192 Unspecified hearing loss, left ear: Secondary | ICD-10-CM | POA: Diagnosis not present

## 2022-04-17 ENCOUNTER — Other Ambulatory Visit: Payer: Self-pay | Admitting: Nurse Practitioner

## 2022-04-20 ENCOUNTER — Other Ambulatory Visit: Payer: Self-pay | Admitting: Cardiology

## 2022-05-19 DIAGNOSIS — H10411 Chronic giant papillary conjunctivitis, right eye: Secondary | ICD-10-CM | POA: Diagnosis not present

## 2022-06-10 ENCOUNTER — Other Ambulatory Visit: Payer: Self-pay | Admitting: Cardiology

## 2022-06-29 ENCOUNTER — Other Ambulatory Visit: Payer: Self-pay | Admitting: Cardiology

## 2022-07-20 ENCOUNTER — Other Ambulatory Visit: Payer: Self-pay | Admitting: Cardiology

## 2022-07-25 ENCOUNTER — Other Ambulatory Visit: Payer: Self-pay | Admitting: Cardiology

## 2022-07-25 ENCOUNTER — Encounter: Payer: Self-pay | Admitting: Cardiology

## 2022-07-26 ENCOUNTER — Telehealth: Payer: Self-pay | Admitting: Cardiology

## 2022-07-26 MED ORDER — PROPRANOLOL HCL 10 MG PO TABS: 10.0000 mg | ORAL_TABLET | Freq: Every day | ORAL | 1 refills | Status: DC

## 2022-07-26 MED ORDER — PROPRANOLOL HCL 10 MG PO TABS: 10.0000 mg | ORAL_TABLET | Freq: Every day | ORAL | 0 refills | Status: DC

## 2022-07-26 NOTE — Telephone Encounter (Signed)
*  STAT* If patient is at the pharmacy, call can be transferred to refill team.   1. Which medications need to be refilled? (please list name of each medication and dose if known) propranolol (INDERAL) 10 MG tablet   2. Which pharmacy/location (including street and city if local pharmacy) is medication to be sent to? [id number] - [place], [place] - [address]   3. Do they need a 30 day or 90 day supply? 90

## 2022-07-26 NOTE — Telephone Encounter (Signed)
Pt's medication was sent to pt's pharmacy as requested. Confirmation received.  °

## 2022-07-26 NOTE — Telephone Encounter (Signed)
*  STAT* If patient is at the pharmacy, call can be transferred to refill team.   1. Which medications need to be refilled? (please list name of each medication and dose if known) propranolol (INDERAL) 10 MG tablet   2. Which pharmacy/location (including street and city if local pharmacy) is medication to be sent to?  [id number] - [place], [place] - [address]      3. Do they need a 30 day or 90 day supply? 90 day    Pt is completely out of medication and has a office visit for 08/28/2022

## 2022-08-28 ENCOUNTER — Ambulatory Visit: Payer: BC Managed Care – PPO | Attending: Cardiology | Admitting: Cardiology

## 2022-08-28 ENCOUNTER — Encounter: Payer: Self-pay | Admitting: Cardiology

## 2022-08-28 VITALS — BP 138/70 | HR 56 | Ht 64.0 in | Wt 131.2 lb

## 2022-08-28 DIAGNOSIS — I4719 Other supraventricular tachycardia: Secondary | ICD-10-CM

## 2022-08-28 DIAGNOSIS — R030 Elevated blood-pressure reading, without diagnosis of hypertension: Secondary | ICD-10-CM | POA: Diagnosis not present

## 2022-08-28 MED ORDER — PROPRANOLOL HCL 10 MG PO TABS: 10.0000 mg | ORAL_TABLET | Freq: Every day | ORAL | 3 refills | Status: DC

## 2022-08-28 NOTE — Patient Instructions (Signed)
Medication Instructions:  Your physician recommends that you continue on your current medications as directed. Please refer to the Current Medication list given to you today.  *If you need a refill on your cardiac medications before your next appointment, please call your pharmacy*   Lab Work: None   Testing/Procedures: None   Follow-Up: At [facility], you and your health needs are our priority.  As part of our continuing mission to provide you with exceptional heart care, we have created designated Provider Care Teams.  These Care Teams include your primary Cardiologist (physician) and Advanced Practice Providers (APPs -  Physician Assistants and Nurse Practitioners) who all work together to provide you with the care you need, when you need it.  Your next appointment:   1 year   Provider:   Thomasene Ripple, DO

## 2022-08-28 NOTE — Progress Notes (Signed)
Cardiology Office Note:    Date:  08/28/2022   ID:  Lindsay Butler, DOB [birth year], MRN [id number]  PCP:  Gerre Scull, NP  Cardiologist:  Thomasene Ripple, DO  Electrophysiologist:  None   Referring MD: Gerre Scull, NP   " I am ok"  History of Present Illness:    Lindsay Butler is a 57 y.o. female with a hx of hypertension, asthma, anxiety, paroxysmal atrial tachycardia propranolol tells me that she is doing well on this she has not experienced any chest pain since starting this medication.  From a heart standpoint she tells me she is doing well.  But she experiencing some knee pain and has been consulting with orthopedics.  She thinks that she may undergo minimal invasive surgery in September.  Past Medical History:  Diagnosis Date   Anemia    Anxiety    Arthritis 07/16/2020   Mostly in neck and knees   Asthma    Heart murmur    Hypertension    Restless leg syndrome     Past Surgical History:  Procedure Laterality Date   KNEE ARTHROSCOPY W/ ACL RECONSTRUCTION Left    LEEP  1999   x 2   TONSILLECTOMY      Current Medications: Current Meds  Medication Sig   albuterol (VENTOLIN HFA) 108 (90 Base) MCG/ACT inhaler Inhale 2 puffs into the lungs every 6 (six) hours as needed for wheezing or shortness of breath.   ALPRAZolam (XANAX) 0.5 MG tablet Take 0.5 mg by mouth 3 (three) times daily as needed for anxiety.   gabapentin (NEURONTIN) 100 MG capsule Take 1 capsule (100 mg total) by mouth at bedtime.   rOPINIRole (REQUIP XL) 8 MG 24 hr tablet Take 1 tablet (8 mg total) by mouth at bedtime.   [DISCONTINUED] propranolol (INDERAL) 10 MG tablet Take 1 tablet (10 mg total) by mouth at bedtime.     Allergies:   Patient has no known allergies.   Social History   Socioeconomic History   Marital status: Divorced    Spouse name: Not on file   Number of children: 0   Years of education: Not on file   Highest education level: Not on file  Occupational History   Occupation:  client service rep  Tobacco Use   Smoking status: Never   Smokeless tobacco: Never  Vaping Use   Vaping status: Never Used  Substance and Sexual Activity   Alcohol use: Yes    Alcohol/week: 1.0 standard drink of alcohol    Types: 1 Standard drinks or equivalent per week    Comment: occ   Drug use: No   Sexual activity: Yes    Birth control/protection: Post-menopausal, None  Other Topics Concern   Not on file  Social History Narrative   Not on file   Social Determinants of Health   Financial Resource Strain: Not on file  Food Insecurity: Not on file  Transportation Needs: Not on file  Physical Activity: Not on file  Stress: Not on file  Social Connections: Not on file     Family History: The patient's family history includes COPD in her father; Diabetes in her mother; Hypertension in her mother; Kidney disease in her mother.  ROS:   Review of Systems  Constitution: Negative for decreased appetite, fever and weight gain.  HENT: Negative for congestion, ear discharge, hoarse voice and sore throat.   Eyes: Negative for discharge, redness, vision loss in right eye and visual halos.  Cardiovascular: Negative for  chest pain, dyspnea on exertion, leg swelling, orthopnea and palpitations.  Respiratory: Negative for cough, hemoptysis, shortness of breath and snoring.   Endocrine: Negative for heat intolerance and polyphagia.  Hematologic/Lymphatic: Negative for bleeding problem. Does not bruise/bleed easily.  Skin: Negative for flushing, nail changes, rash and suspicious lesions.  Musculoskeletal: Negative for arthritis, joint pain, muscle cramps, myalgias, neck pain and stiffness.  Gastrointestinal: Negative for abdominal pain, bowel incontinence, diarrhea and excessive appetite.  Genitourinary: Negative for decreased libido, genital sores and incomplete emptying.  Neurological: Negative for brief paralysis, focal weakness, headaches and loss of balance.  Psychiatric/Behavioral:  Negative for altered mental status, depression and suicidal ideas.  Allergic/Immunologic: Negative for HIV exposure and persistent infections.    EKGs/Labs/Other Studies Reviewed:    The following studies were reviewed today:   EKG:  The ekg ordered today demonstrates   Recent Labs: No results found for requested labs within last 365 days.  Recent Lipid Panel    Component Value Date/Time   CHOL 183 07/15/2021 0804   TRIG 88.0 07/15/2021 0804   HDL 62.00 07/15/2021 0804   CHOLHDL 3 07/15/2021 0804   VLDL 17.6 07/15/2021 0804   LDLCALC 104 (H) 07/15/2021 0804    Physical Exam:    VS:  BP 138/70 (BP Location: Right Arm, Patient Position: Sitting, Cuff Size: Normal)   Pulse (!) 56   Ht 5\' 4"  (1.626 m)   Wt 131 lb 3.2 oz (59.5 kg)   LMP 03/01/2016 (Exact Date)   SpO2 98%   BMI 22.52 kg/m     Wt Readings from Last 3 Encounters:  08/28/22 131 lb 3.2 oz (59.5 kg)  02/08/22 139 lb (63 kg)  07/14/21 133 lb 12.8 oz (60.7 kg)     GEN: Well nourished, well developed in no acute distress HEENT: Normal NECK: No JVD; No carotid bruits LYMPHATICS: No lymphadenopathy CARDIAC: S1S2 noted,RRR, no murmurs, rubs, gallops RESPIRATORY:  Clear to auscultation without rales, wheezing or rhonchi  ABDOMEN: Soft, non-tender, non-distended, +bowel sounds, no guarding. EXTREMITIES: No edema, No cyanosis, no clubbing MUSCULOSKELETAL:  No deformity  SKIN: Warm and dry NEUROLOGIC:  Alert and oriented x 3, non-focal PSYCHIATRIC:  Normal affect, good insight  ASSESSMENT:    1. PAT (paroxysmal atrial tachycardia)   2. Elevated blood pressure reading    PLAN:    She is doing well from a cardiovascular standpoint.,  Continue propranolol.  Will send refills.  In terms of her pending surgery, the patient does not have any unstable cardiac conditions.  Upon evaluation today, she can achieve 4 METs or greater without anginal symptoms.  According to [facility] and AHA guidelines, she requires no further  cardiac workup prior to her noncardiac surgery and should be at acceptable risk.  Our service is available as necessary in the perioperative period.  Her blood pressure is elevated in the office today this seems to be an isolated event.  Will continue to monitor.  She is undergoing a lot of stress recently this may be contributing.  Will reassess.  The patient is in agreement with the above plan. The patient left the office in stable condition.  The patient will follow up in 1 year   Medication Adjustments/Labs and Tests Ordered: Current medicines are reviewed at length with the patient today.  Concerns regarding medicines are outlined above.  No orders of the defined types were placed in this encounter.  Meds ordered this encounter  Medications   propranolol (INDERAL) 10 MG tablet  Sig: Take 1 tablet (10 mg total) by mouth at bedtime.    Dispense:  90 tablet    Refill:  3    Patient Instructions  Medication Instructions:  Your physician recommends that you continue on your current medications as directed. Please refer to the Current Medication list given to you today.  *If you need a refill on your cardiac medications before your next appointment, please call your pharmacy*   Lab Work: None   Testing/Procedures: None   Follow-Up: At [facility], you and your health needs are our priority.  As part of our continuing mission to provide you with exceptional heart care, we have created designated Provider Care Teams.  These Care Teams include your primary Cardiologist (physician) and Advanced Practice Providers (APPs -  Physician Assistants and Nurse Practitioners) who all work together to provide you with the care you need, when you need it.  Your next appointment:   1 year   Provider:   Thomasene Ripple, DO      Adopting a Healthy Lifestyle.  Know what a healthy weight is for you (roughly BMI <25) and aim to maintain this   Aim for 7+ servings of fruits and  vegetables daily   65-80+ fluid ounces of water or unsweet tea for healthy kidneys   Limit to max 1 drink of alcohol per day; avoid smoking/tobacco   Limit animal fats in diet for cholesterol and heart health - choose grass fed whenever available   Avoid highly processed foods, and foods high in saturated/trans fats   Aim for low stress - take time to unwind and care for your mental health   Aim for 150 min of moderate intensity exercise weekly for heart health, and weights twice weekly for bone health   Aim for 7-9 hours of sleep daily   When it comes to diets, agreement about the perfect plan isnt easy to find, even among the experts. Experts at the [facility] of [employer] developed an idea known as the Healthy Eating Plate. Just imagine a plate divided into logical, healthy portions.   The emphasis is on diet quality:   Load up on vegetables and fruits - one-half of your plate: Aim for color and variety, and remember that potatoes dont count.   Go for whole grains - one-quarter of your plate: Whole wheat, barley, wheat berries, quinoa, oats, brown rice, and foods made with them. If you want pasta, go with whole wheat pasta.   Protein power - one-quarter of your plate: Fish, chicken, beans, and nuts are all healthy, versatile protein sources. Limit red meat.   The diet, however, does go beyond the plate, offering a few other suggestions.   Use healthy plant oils, such as olive, canola, soy, corn, sunflower and peanut. Check the labels, and avoid partially hydrogenated oil, which have unhealthy trans fats.   If youre thirsty, drink water. Coffee and tea are good in moderation, but skip sugary drinks and limit milk and dairy products to one or two daily servings.   The type of carbohydrate in the diet is more important than the amount. Some sources of carbohydrates, such as vegetables, fruits, whole grains, and beans-are healthier than others.   Finally, stay  active  Signed, Thomasene Ripple, DO  08/28/2022 8:38 AM    [facility]

## 2022-09-06 ENCOUNTER — Other Ambulatory Visit: Payer: Self-pay | Admitting: Nurse Practitioner

## 2022-09-07 NOTE — Telephone Encounter (Signed)
Appt shceduled.

## 2022-09-07 NOTE — Telephone Encounter (Signed)
Requesting: ROPINIROLE HCL ER 8 MG TABLET  Last Visit: 02/22/2022 Next Visit: Visit date not found Last Refill: 02/08/2022  Please Advise

## 2022-09-19 ENCOUNTER — Telehealth: Payer: Self-pay | Admitting: Nurse Practitioner

## 2022-09-19 NOTE — Telephone Encounter (Signed)
Pt is wondering if her appt for 09/20/22 can be done virtually. It's for a med check. Please advise at [phone].

## 2022-09-20 ENCOUNTER — Encounter: Payer: Self-pay | Admitting: Nurse Practitioner

## 2022-09-20 ENCOUNTER — Ambulatory Visit (INDEPENDENT_AMBULATORY_CARE_PROVIDER_SITE_OTHER): Payer: BC Managed Care – PPO | Admitting: Nurse Practitioner

## 2022-09-20 VITALS — BP 102/68 | HR 45 | Temp 97.9°F | Ht 64.0 in | Wt 129.2 lb

## 2022-09-20 DIAGNOSIS — I4719 Other supraventricular tachycardia: Secondary | ICD-10-CM

## 2022-09-20 DIAGNOSIS — G2581 Restless legs syndrome: Secondary | ICD-10-CM | POA: Diagnosis not present

## 2022-09-20 DIAGNOSIS — Z0000 Encounter for general adult medical examination without abnormal findings: Secondary | ICD-10-CM | POA: Diagnosis not present

## 2022-09-20 MED ORDER — ROPINIROLE HCL ER 8 MG PO TB24: 16.0000 mg | ORAL_TABLET | Freq: Every day | ORAL | 3 refills | Status: DC

## 2022-09-20 MED ORDER — ROPINIROLE HCL ER 8 MG PO TB24: 16.0000 mg | ORAL_TABLET | Freq: Every day | ORAL | 11 refills | Status: DC

## 2022-09-20 MED ORDER — ROPINIROLE HCL ER 8 MG PO TB24: 16.0000 mg | ORAL_TABLET | Freq: Every day | ORAL | 1 refills | Status: DC

## 2022-09-20 NOTE — Assessment & Plan Note (Signed)
Health maintenance reviewed and updated. Discussed nutrition, exercise. Declines labs today, normal last year. Follow-up 1 year.

## 2022-09-20 NOTE — Assessment & Plan Note (Signed)
Chronic, stable.  Well-controlled with propanolol 10 mg daily.  She follows with cardiology as needed.  Follow-up with any concerns.

## 2022-09-20 NOTE — Progress Notes (Signed)
BP 102/68 (BP Location: Left Arm)   Pulse (!) 45   Temp 97.9 F (36.6 C) (Oral)   Ht 5\' 4"  (1.626 m)   Wt 129 lb 3.2 oz (58.6 kg)   LMP 03/01/2016 (Exact Date)   SpO2 100%   BMI 22.18 kg/m    Subjective:    Patient ID: Lindsay Butler, female    DOB: April 25, 1965, 57 y.o.   MRN: [id number]  CC: Chief Complaint  Patient presents with   Medication Management    Discuss Requip    HPI: Lindsay Butler is a 57 y.o. female presenting on 09/20/2022 for comprehensive medical examination. Current medical complaints include: restless leg  She states that her restless leg is starting to get slightly worse.  She states that she has been taking between 8 to 16 mg of the Requip.  She also has gabapentin at home that she can take as needed.  She wants to make sure that it is okay to take 16 mg of the Requip.  She currently lives with: roommate Menopausal Symptoms: no  Depression and Anxiety Screen done today and results listed below:     09/20/2022   11:30 AM 07/12/2021   11:48 AM 09/04/2017    9:26 AM 08/27/2017    9:27 AM 02/16/2016    3:58 PM  Depression screen PHQ 2/9  Decreased Interest 0 0 0 0 0  Down, Depressed, Hopeless 0 0 0 0 0  PHQ - 2 Score 0 0 0 0 0  Altered sleeping  2   3  Tired, decreased energy  0   0  Change in appetite  0   0  Feeling bad or failure about yourself   1   1  Trouble concentrating  0   0  Moving slowly or fidgety/restless  0   0  Suicidal thoughts  0   0  PHQ-9 Score  3   4  Difficult doing work/chores  Somewhat difficult         07/12/2021   11:49 AM 02/16/2016    4:00 PM  GAD 7 : Generalized Anxiety Score  Nervous, Anxious, on Edge 0 0  Control/stop worrying 0 0  Worry too much - different things 0 2  Trouble relaxing 0 1  Restless 0 1  Easily annoyed or irritable 0 0  Afraid - awful might happen 0 1  Total GAD 7 Score 0 5    The patient does not have a history of falls. I did not complete a risk assessment for falls. A plan of care for falls was  not documented.   Past Medical History:  Past Medical History:  Diagnosis Date   Anemia    Anxiety    Arthritis 07/16/2020   Mostly in neck and knees   Asthma    Heart murmur    Hypertension    Restless leg syndrome     Surgical History:  Past Surgical History:  Procedure Laterality Date   KNEE ARTHROSCOPY W/ ACL RECONSTRUCTION Left    LEEP  1999   x 2   TONSILLECTOMY      Medications:  Current Outpatient Medications on File Prior to Visit  Medication Sig   propranolol (INDERAL) 10 MG tablet Take 1 tablet (10 mg total) by mouth at bedtime.   albuterol (VENTOLIN HFA) 108 (90 Base) MCG/ACT inhaler Inhale 2 puffs into the lungs every 6 (six) hours as needed for wheezing or shortness of breath. (Patient not taking: Reported on 09/20/2022)  gabapentin (NEURONTIN) 100 MG capsule Take 1 capsule (100 mg total) by mouth at bedtime. (Patient not taking: Reported on 09/20/2022)   No current facility-administered medications on file prior to visit.    Allergies:  No Known Allergies  Social History:  Social History   Socioeconomic History   Marital status: Divorced    Spouse name: Not on file   Number of children: 0   Years of education: Not on file   Highest education level: Not on file  Occupational History   Occupation: client service rep  Tobacco Use   Smoking status: Never   Smokeless tobacco: Never  Vaping Use   Vaping status: Never Used  Substance and Sexual Activity   Alcohol use: Yes    Alcohol/week: 1.0 standard drink of alcohol    Types: 1 Standard drinks or equivalent per week    Comment: occ   Drug use: No   Sexual activity: Yes    Birth control/protection: Post-menopausal, None  Other Topics Concern   Not on file  Social History Narrative   Not on file   Social Determinants of Health   Financial Resource Strain: Not on file  Food Insecurity: Not on file  Transportation Needs: Not on file  Physical Activity: Not on file  Stress: Not on file   Social Connections: Not on file  Intimate Partner Violence: Not on file   Social History   Tobacco Use  Smoking Status Never  Smokeless Tobacco Never   Social History   Substance and Sexual Activity  Alcohol Use Yes   Alcohol/week: 1.0 standard drink of alcohol   Types: 1 Standard drinks or equivalent per week   Comment: occ    Family History:  Family History  Problem Relation Age of Onset   Diabetes Mother    Hypertension Mother    Kidney disease Mother    COPD Father     Past medical history, surgical history, medications, allergies, family history and social history reviewed with patient today and changes made to appropriate areas of the chart.   Review of Systems  Constitutional: Negative.   HENT: Negative.    Eyes: Negative.   Respiratory: Negative.    Cardiovascular: Negative.   Gastrointestinal: Negative.   Genitourinary: Negative.   Musculoskeletal:  Positive for joint pain (left knee).       Restless leg at night   Skin: Negative.   Neurological: Negative.   Psychiatric/Behavioral: Negative.     All other ROS negative except what is listed above and in the HPI.      Objective:    BP 102/68 (BP Location: Left Arm)   Pulse (!) 45   Temp 97.9 F (36.6 C) (Oral)   Ht 5\' 4"  (1.626 m)   Wt 129 lb 3.2 oz (58.6 kg)   LMP 03/01/2016 (Exact Date)   SpO2 100%   BMI 22.18 kg/m   Wt Readings from Last 3 Encounters:  09/20/22 129 lb 3.2 oz (58.6 kg)  08/28/22 131 lb 3.2 oz (59.5 kg)  02/08/22 139 lb (63 kg)    Physical Exam Vitals and nursing note reviewed.  Constitutional:      General: She is not in acute distress.    Appearance: Normal appearance.  HENT:     Head: Normocephalic and atraumatic.     Right Ear: Tympanic membrane, ear canal and external ear normal.     Left Ear: Tympanic membrane, ear canal and external ear normal.     Mouth/Throat:  Mouth: Mucous membranes are moist.     Pharynx: Oropharynx is clear. No posterior  oropharyngeal erythema.  Eyes:     Conjunctiva/sclera: Conjunctivae normal.  Cardiovascular:     Rate and Rhythm: Normal rate and regular rhythm.     Pulses: Normal pulses.     Heart sounds: Normal heart sounds.  Pulmonary:     Effort: Pulmonary effort is normal.     Breath sounds: Normal breath sounds.  Abdominal:     Palpations: Abdomen is soft.     Tenderness: There is no abdominal tenderness.  Musculoskeletal:        General: Normal range of motion.     Cervical back: Normal range of motion and neck supple.     Right lower leg: No edema.     Left lower leg: No edema.  Lymphadenopathy:     Cervical: No cervical adenopathy.  Skin:    General: Skin is warm and dry.  Neurological:     General: No focal deficit present.     Mental Status: She is alert and oriented to person, place, and time.     Cranial Nerves: No cranial nerve deficit.     Coordination: Coordination normal.     Gait: Gait normal.  Psychiatric:        Mood and Affect: Mood normal.        Behavior: Behavior normal.        Thought Content: Thought content normal.        Judgment: Judgment normal.     Results for orders placed or performed in visit on 07/15/21  TSH  Result Value Ref Range   TSH 0.83 0.35 - 5.50 uIU/mL  Iron, TIBC and Ferritin Panel  Result Value Ref Range   Iron 82 45 - 160 mcg/dL   TIBC 161 096 - 045 mcg/dL (calc)   %SAT 26 16 - 45 % (calc)   Ferritin 44 16 - 232 ng/mL  Lipid panel  Result Value Ref Range   Cholesterol 183 0 - 200 mg/dL   Triglycerides 40.9 0.0 - 149.0 mg/dL   HDL 81.19 >14.78 mg/dL   VLDL 29.5 0.0 - 62.1 mg/dL   LDL Cholesterol 308 (H) 0 - 99 mg/dL   Total CHOL/HDL Ratio 3    NonHDL 121.27   Comprehensive metabolic panel  Result Value Ref Range   Sodium 140 135 - 145 mEq/L   Potassium 4.5 3.5 - 5.1 mEq/L   Chloride 104 96 - 112 mEq/L   CO2 30 19 - 32 mEq/L   Glucose, Bld 88 70 - 99 mg/dL   BUN 14 6 - 23 mg/dL   Creatinine, Ser 6.57 0.40 - 1.20 mg/dL    Total Bilirubin 1.5 (H) 0.2 - 1.2 mg/dL   Alkaline Phosphatase 84 39 - 117 U/L   AST 19 0 - 37 U/L   ALT 12 0 - 35 U/L   Total Protein 6.9 6.0 - 8.3 g/dL   Albumin 4.3 3.5 - 5.2 g/dL   GFR 84.69 >62.95 mL/min   Calcium 9.4 8.4 - 10.5 mg/dL  CBC with Differential/Platelet  Result Value Ref Range   WBC 5.4 4.0 - 10.5 K/uL   RBC 4.23 3.87 - 5.11 Mil/uL   Hemoglobin 13.0 12.0 - 15.0 g/dL   HCT 28.4 13.2 - 44.0 %   MCV 92.6 78.0 - 100.0 fl   MCHC 33.3 30.0 - 36.0 g/dL   RDW 10.2 72.5 - 36.6 %   Platelets 211.0 150.0 - 400.0  K/uL   Neutrophils Relative % 59.5 43.0 - 77.0 %   Lymphocytes Relative 30.5 12.0 - 46.0 %   Monocytes Relative 8.1 3.0 - 12.0 %   Eosinophils Relative 1.4 0.0 - 5.0 %   Basophils Relative 0.5 0.0 - 3.0 %   Neutro Abs 3.2 1.4 - 7.7 K/uL   Lymphs Abs 1.6 0.7 - 4.0 K/uL   Monocytes Absolute 0.4 0.1 - 1.0 K/uL   Eosinophils Absolute 0.1 0.0 - 0.7 K/uL   Basophils Absolute 0.0 0.0 - 0.1 K/uL      Assessment & Plan:   Problem List Items Addressed This Visit       Cardiovascular and Mediastinum   PAT (paroxysmal atrial tachycardia)    Chronic, stable.  Well-controlled with propanolol 10 mg daily.  She follows with cardiology as needed.  Follow-up with any concerns.        Other   Restless leg syndrome    Chronic, ongoing.  She can take Requip XL 8 to 16 mg daily at bedtime.  She can also take gabapentin 100 mg as needed at bedtime as well.  Do not take more than 60 mg of Requip.Marland Kitchen  Refill sent to the pharmacy.  Follow-up in 1 year or sooner with concerns.      Routine general medical examination at a health care facility - Primary    Health maintenance reviewed and updated. Discussed nutrition, exercise. Declines labs today, normal last year. Follow-up 1 year.          Follow up plan: Return in about 1 year (around 09/20/2023) for CPE.   LABORATORY TESTING:  - Pap smear: done elsewhere  IMMUNIZATIONS:   - Tdap: Tetanus vaccination status reviewed:  last tetanus booster within 10 years. - Influenza:  Declined - Pneumovax: Not applicable - Prevnar: Not applicable - HPV: Not applicable - Shingrix vaccine:  Declined  SCREENING: -Mammogram: Up to date  - Colonoscopy: Up to date  - Bone Density: Up to date   PATIENT COUNSELING:   Advised to take 1 mg of folate supplement per day if capable of pregnancy.   Sexuality: Discussed sexually transmitted diseases, partner selection, use of condoms, avoidance of unintended pregnancy  and contraceptive alternatives.   Advised to avoid cigarette smoking.  I discussed with the patient that most people either abstain from alcohol or drink within safe limits (<=14/week and <=4 drinks/occasion for males, <=7/weeks and <= 3 drinks/occasion for females) and that the risk for alcohol disorders and other health effects rises proportionally with the number of drinks per week and how often a drinker exceeds daily limits.  Discussed cessation/primary prevention of drug use and availability of treatment for abuse.   Diet: Encouraged to adjust caloric intake to maintain  or achieve ideal body weight, to reduce intake of dietary saturated fat and total fat, to limit sodium intake by avoiding high sodium foods and not adding table salt, and to maintain adequate dietary potassium and calcium preferably from fresh fruits, vegetables, and low-fat dairy products.    stressed the importance of regular exercise  Injury prevention: Discussed safety belts, safety helmets, smoke detector, smoking near bedding or upholstery.   Dental health: Discussed importance of regular tooth brushing, flossing, and dental visits.    NEXT PREVENTATIVE PHYSICAL DUE IN 1 YEAR. Return in about 1 year (around 09/20/2023) for CPE.  Mechel Schutter A Jovanie Verge

## 2022-09-20 NOTE — Telephone Encounter (Signed)
Patient scheduled for an in office visit for 09/20/22.

## 2022-09-20 NOTE — Assessment & Plan Note (Signed)
Chronic, ongoing.  She can take Requip XL 8 to 16 mg daily at bedtime.  She can also take gabapentin 100 mg as needed at bedtime as well.  Do not take more than 60 mg of Requip.Marland Kitchen  Refill sent to the pharmacy.  Follow-up in 1 year or sooner with concerns.

## 2022-09-20 NOTE — Patient Instructions (Signed)
It was great to see you!  Increase your requip to 16mg  daily. You may also take gabapentin as needed  Let's follow-up in 1 year, sooner if you have concerns.  If a referral was placed today, you will be contacted for an appointment. Please note that routine referrals can sometimes take up to 3-4 weeks to process. Please call our office if you haven't heard anything after this time frame.  Take care,  Rodman Pickle, NP

## 2022-10-09 ENCOUNTER — Ambulatory Visit (INDEPENDENT_AMBULATORY_CARE_PROVIDER_SITE_OTHER): Payer: BC Managed Care – PPO

## 2022-10-09 ENCOUNTER — Ambulatory Visit (HOSPITAL_BASED_OUTPATIENT_CLINIC_OR_DEPARTMENT_OTHER): Payer: BC Managed Care – PPO | Admitting: Orthopaedic Surgery

## 2022-10-09 ENCOUNTER — Encounter (HOSPITAL_BASED_OUTPATIENT_CLINIC_OR_DEPARTMENT_OTHER): Payer: Self-pay | Admitting: Orthopaedic Surgery

## 2022-10-09 DIAGNOSIS — G8929 Other chronic pain: Secondary | ICD-10-CM

## 2022-10-09 DIAGNOSIS — M1712 Unilateral primary osteoarthritis, left knee: Secondary | ICD-10-CM

## 2022-10-09 DIAGNOSIS — M25562 Pain in left knee: Secondary | ICD-10-CM

## 2022-10-09 NOTE — Progress Notes (Signed)
Chief Complaint: Left knee pain     History of Present Illness:    Lindsay Butler is a 57 y.o. female presents today with medial sided knee pain which has been ongoing for the last several years.  She states that she is experience significant swelling particular with activity such as pickleball.  She is interested in staying active and pickleball.  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  She does have a history of an ACL reconstruction done in the 90s.  Denies any recurrent instability.  Her pain is predominantly medial.    Surgical History:   As above  PMH/PSH/Family History/Social History/Meds/Allergies:    Past Medical History:  Diagnosis Date   Anemia    Anxiety    Arthritis 07/16/2020   Mostly in neck and knees   Asthma    Heart murmur    Hypertension    Restless leg syndrome    Past Surgical History:  Procedure Laterality Date   KNEE ARTHROSCOPY W/ ACL RECONSTRUCTION Left    LEEP  1999   x 2   TONSILLECTOMY     Social History   Socioeconomic History   Marital status: Divorced    Spouse name: Not on file   Number of children: 0   Years of education: Not on file   Highest education level: Not on file  Occupational History   Occupation: client service rep  Tobacco Use   Smoking status: Never   Smokeless tobacco: Never  Vaping Use   Vaping status: Never Used  Substance and Sexual Activity   Alcohol use: Yes    Alcohol/week: 1.0 standard drink of alcohol    Types: 1 Standard drinks or equivalent per week    Comment: occ   Drug use: No   Sexual activity: Yes    Birth control/protection: Post-menopausal, None  Other Topics Concern   Not on file  Social History Narrative   Not on file   Social Determinants of Health   Financial Resource Strain: Not on file  Food Insecurity: Not on file  Transportation Needs: Not on file  Physical Activity: Not  on file  Stress: Not on file  Social Connections: Not on file   Family History  Problem Relation Age of Onset   Diabetes Mother    Hypertension Mother    Kidney disease Mother    COPD Father    No Known Allergies Current Outpatient Medications  Medication Sig Dispense Refill   albuterol (VENTOLIN HFA) 108 (90 Base) MCG/ACT inhaler Inhale 2 puffs into the lungs every 6 (six) hours as needed for wheezing or shortness of breath. (Patient not taking: Reported on 09/20/2022) 8 g 0   gabapentin (NEURONTIN) 100 MG capsule Take 1 capsule (100 mg total) by mouth at bedtime. (Patient not taking: Reported on 09/20/2022) 90 capsule 1   propranolol (INDERAL) 10 MG tablet Take 1 tablet (10 mg total) by mouth at bedtime. 90 tablet 3   rOPINIRole (REQUIP XL) 8 MG 24 hr tablet Take 2 tablets (16 mg total) by mouth at bedtime. 60 tablet 11   No current facility-administered medications for this visit.   No results found.  Review of Systems:   A ROS was performed including pertinent positives and negatives as  documented in the HPI.  Physical Exam :   Constitutional: NAD and appears stated age Neurological: Alert and oriented Psych: Appropriate affect and cooperative Last menstrual period 03/01/2016.   Comprehensive Musculoskeletal Exam:      Musculoskeletal Exam  Gait Normal  Alignment Normal   Right Left  Inspection Normal Normal  Palpation    Tenderness None Medial joint line  Crepitus None None  Effusion None Trace  Range of Motion    Extension -3 -3  Flexion 135 135  Strength    Extension 5/5 5/5  Flexion 5/5 5/5  Ligament Exam     Generalized Laxity No No  Lachman Negative Negative   Pivot Shift Negative Negative  Anterior Drawer Negative Negative  Valgus at 0 Negative Negative  Valgus at 20 Negative Negative  Varus at 0 0 0  Varus at 20   0 0  Posterior Drawer at 90 0 0  Vascular/Lymphatic Exam    Edema None None  Venous Stasis Changes No No  Distal Circulation Normal  Normal  Neurologic    Light Touch Sensation Intact Intact  Special Tests:      Imaging:   Xray (4 views left knee): Medial joint space narrowing with evidence of previous ACL reconstruction    I personally reviewed and interpreted the radiographs.   Assessment:   57 y.o. female with evidence of medial joint compartment osteoarthritis after previous ACL reconstruction.  At this time she denies any instability of the knee.  That being said she is still having persistent medial based knee pain.  I did discuss that I would prefer to avoid any type of steroid injections that she does have a history of an ACL graft.  She has previously had physical therapy for strengthening of the left knee without any relief.  Given this I do ultimately believe that she may be a commit candidate for medial unicompartmental knee arthroplasty on that being said I would like to start with an MRI prior to assess for the status of her graft as well as assess her underlying cartilage  Plan :    -Plan for MRI left knee and follow-up discuss results     I personally saw and evaluated the patient, and participated in the management and treatment plan.  Huel Cote, MD Attending Physician, Orthopedic Surgery  This document was dictated using Dragon voice recognition software. A reasonable attempt at proof reading has been made to minimize errors.

## 2022-10-11 ENCOUNTER — Telehealth: Payer: Self-pay | Admitting: Nurse Practitioner

## 2022-10-11 NOTE — Telephone Encounter (Signed)
Pt thinks she has poison ivey. She is wonderng if this can be a mychart visit? I offered in office, she declined. Please advise pt at [phone]

## 2022-10-12 ENCOUNTER — Telehealth (INDEPENDENT_AMBULATORY_CARE_PROVIDER_SITE_OTHER): Payer: BC Managed Care – PPO | Admitting: Internal Medicine

## 2022-10-12 ENCOUNTER — Encounter: Payer: Self-pay | Admitting: Internal Medicine

## 2022-10-12 DIAGNOSIS — L237 Allergic contact dermatitis due to plants, except food: Secondary | ICD-10-CM

## 2022-10-12 MED ORDER — MOMETASONE FUROATE 0.1 % EX CREA: TOPICAL_CREAM | CUTANEOUS | 1 refills | Status: DC

## 2022-10-12 NOTE — Progress Notes (Signed)
[facility] [address] [place] [address] Dept: [phone] Dept Fax: [phone]  Virtual Video Visit  I connected with Lindsay Butler on 10/12/22 at 10:40 AM EDT by a video enabled telemedicine application and verified that I am speaking with the correct person using two identifiers.   Location patient: Home Location provider: Clinic Total time: 6 minutes Persons participating in the virtual visit: Patient; [name] CMA; Salvatore Decent, FNP-C  I discussed the limitations of evaluation and management by telemedicine and the availability of in-person appointments. The patient expressed understanding and agreed to proceed.  Chief Complaint  Patient presents with   Poison Ivy    Started 5 days    SUBJECTIVE:  HPI:  Lindsay Butler presents complaining of rash to left arm. She states she pick up her dog and day after saw the rash to left arm.  Onset: 5 days ago  Associated symptoms: itching  Treatments tried: none  The following portions of the patient's history were reviewed and updated as appropriate: medical history, surgical history, medications, allergies, social history, and family history.    Past Medical History:  Diagnosis Date   Anemia    Anxiety    Arthritis 07/16/2020   Mostly in neck and knees   Asthma    Heart murmur    Hypertension    Restless leg syndrome    Past Surgical History:  Procedure Laterality Date   KNEE ARTHROSCOPY W/ ACL RECONSTRUCTION Left    LEEP  1999   x 2   TONSILLECTOMY       Current Outpatient Medications:    mometasone (ELOCON) 0.1 % cream, Apply to affected area 2 times a day., Disp: 45 g, Rfl: 1   propranolol (INDERAL) 10 MG tablet, Take 1 tablet (10 mg total) by mouth at bedtime., Disp: 90 tablet, Rfl: 3   rOPINIRole (REQUIP XL) 8 MG 24 hr tablet, Take 2 tablets (16 mg total) by mouth at bedtime., Disp: 60 tablet, Rfl: 11   albuterol (VENTOLIN HFA) 108 (90 Base)  MCG/ACT inhaler, Inhale 2 puffs into the lungs every 6 (six) hours as needed for wheezing or shortness of breath. (Patient not taking: Reported on 09/20/2022), Disp: 8 g, Rfl: 0   gabapentin (NEURONTIN) 100 MG capsule, Take 1 capsule (100 mg total) by mouth at bedtime. (Patient not taking: Reported on 09/20/2022), Disp: 90 capsule, Rfl: 1 No Known Allergies  Social History   Socioeconomic History   Marital status: Divorced    Spouse name: Not on file   Number of children: 0   Years of education: Not on file   Highest education level: Not on file  Occupational History   Occupation: client service rep  Tobacco Use   Smoking status: Never   Smokeless tobacco: Never  Vaping Use   Vaping status: Never Used  Substance and Sexual Activity   Alcohol use: Yes    Alcohol/week: 1.0 standard drink of alcohol    Types: 1 Standard drinks or equivalent per week    Comment: occ   Drug use: No   Sexual activity: Yes    Birth control/protection: Post-menopausal, None  Other Topics Concern   Not on file  Social History Narrative   Not on file   Social Determinants of Health   Financial Resource Strain: Not on file  Food Insecurity: Not on file  Transportation Needs: Not on file  Physical Activity: Not on file  Stress: Not on file  Social Connections: Not  on file  Intimate Partner Violence: Not on file    Family History  Problem Relation Age of Onset   Diabetes Mother    Hypertension Mother    Kidney disease Mother    COPD Father      ROS: A complete ROS was performed with pertinent positives/negatives noted in the HPI. The remainder of the ROS are negative.    OBJECTIVE:  VITALS per patient if applicable: There were no vitals filed for this visit. There is no height or weight on file to calculate BMI.   GENERAL: Alert and oriented. Appears well and in no acute distress. SKIN: Localized erythematous vesicular and papular rash to left forearm HEENT: Atraumatic. Conjunctiva  clear. No obvious abnormalities on inspection of external nose and ears. NECK: Normal movements of the head and neck. LUNGS: On inspection, no signs of respiratory distress. Breathing rate appears normal. No obvious gross SOB, gasping or wheezing, and no conversational dyspnea. CV: No obvious cyanosis. MS: Moves all visible extremities without noticeable abnormality. PSYCH/NEURO: Pleasant and cooperative. No obvious depression or anxiety. Speech and thought processing grossly intact.  ASSESSMENT AND PLAN: 1. Poison ivy dermatitis - mometasone (ELOCON) 0.1 % cream; Apply to affected area 2 times a day.  Dispense: 45 g; Refill: 1    I discussed the assessment and treatment plan with the patient. The patient was provided an opportunity to ask questions and all were answered. The patient agreed with the plan and demonstrated an understanding of the instructions.   The patient was advised to call back or seek an in-person evaluation if the symptoms worsen or if the condition fails to improve as anticipated.  Return if symptoms worsen or fail to improve.  Salvatore Decent, FNP

## 2022-10-13 NOTE — Telephone Encounter (Signed)
Patient had a video visit on 10/12/22 with Salvatore Decent.

## 2022-11-03 ENCOUNTER — Ambulatory Visit (HOSPITAL_BASED_OUTPATIENT_CLINIC_OR_DEPARTMENT_OTHER): Payer: BC Managed Care – PPO | Admitting: Orthopaedic Surgery

## 2022-11-03 ENCOUNTER — Ambulatory Visit
Admission: RE | Admit: 2022-11-03 | Discharge: 2022-11-03 | Disposition: A | Discharge status: Home / Self Care | Payer: BC Managed Care – PPO | Source: Ambulatory Visit | Attending: Orthopaedic Surgery

## 2022-11-03 DIAGNOSIS — M23322 Other meniscus derangements, posterior horn of medial meniscus, left knee: Secondary | ICD-10-CM | POA: Diagnosis not present

## 2022-11-03 DIAGNOSIS — M25462 Effusion, left knee: Secondary | ICD-10-CM | POA: Diagnosis not present

## 2022-11-03 DIAGNOSIS — M1712 Unilateral primary osteoarthritis, left knee: Secondary | ICD-10-CM

## 2022-11-03 DIAGNOSIS — M25562 Pain in left knee: Secondary | ICD-10-CM | POA: Diagnosis not present

## 2022-11-15 ENCOUNTER — Ambulatory Visit (HOSPITAL_BASED_OUTPATIENT_CLINIC_OR_DEPARTMENT_OTHER): Payer: BC Managed Care – PPO | Admitting: Orthopaedic Surgery

## 2022-11-15 DIAGNOSIS — M1712 Unilateral primary osteoarthritis, left knee: Secondary | ICD-10-CM | POA: Diagnosis not present

## 2022-11-15 NOTE — Progress Notes (Signed)
Chief Complaint: Left knee pain     History of Present Illness:   11/15/2022: Lindsay Butler presents today for her MRI follow-up.  Her symptoms are unchanged.  Lindsay Butler is a 57 y.o. female presents today with medial sided knee pain which has been ongoing for the last several years.  She states that she is experience significant swelling particular with activity such as pickleball.  She is interested in staying active and pickleball.  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  She does have a history of an ACL reconstruction done in the 90s.  Denies any recurrent instability.  Her pain is predominantly medial.    Surgical History:   As above  PMH/PSH/Family History/Social History/Meds/Allergies:    Past Medical History:  Diagnosis Date   Anemia    Anxiety    Arthritis 07/16/2020   Mostly in neck and knees   Asthma    Heart murmur    Hypertension    Restless leg syndrome    Past Surgical History:  Procedure Laterality Date   KNEE ARTHROSCOPY W/ ACL RECONSTRUCTION Left    LEEP  1999   x 2   TONSILLECTOMY     Social History   Socioeconomic History   Marital status: Divorced    Spouse name: Not on file   Number of children: 0   Years of education: Not on file   Highest education level: Not on file  Occupational History   Occupation: client service rep  Tobacco Use   Smoking status: Never   Smokeless tobacco: Never  Vaping Use   Vaping status: Never Used  Substance and Sexual Activity   Alcohol use: Yes    Alcohol/week: 1.0 standard drink of alcohol    Types: 1 Standard drinks or equivalent per week    Comment: occ   Drug use: No   Sexual activity: Yes    Birth control/protection: Post-menopausal, None  Other Topics Concern   Not on file  Social History Narrative   Not on file   Social Determinants of Health   Financial Resource Strain: Not on file   Food Insecurity: Not on file  Transportation Needs: Not on file  Physical Activity: Not on file  Stress: Not on file  Social Connections: Not on file   Family History  Problem Relation Age of Onset   Diabetes Mother    Hypertension Mother    Kidney disease Mother    COPD Father    No Known Allergies Current Outpatient Medications  Medication Sig Dispense Refill   albuterol (VENTOLIN HFA) 108 (90 Base) MCG/ACT inhaler Inhale 2 puffs into the lungs every 6 (six) hours as needed for wheezing or shortness of breath. (Patient not taking: Reported on 09/20/2022) 8 g 0   gabapentin (NEURONTIN) 100 MG capsule Take 1 capsule (100 mg total) by mouth at bedtime. (Patient not taking: Reported on 09/20/2022) 90 capsule 1   mometasone (ELOCON) 0.1 % cream Apply to affected area 2 times a day. 45 g 1   propranolol (INDERAL) 10 MG tablet Take 1 tablet (10 mg total) by mouth at bedtime. 90 tablet 3   rOPINIRole (REQUIP XL) 8 MG 24 hr tablet Take 2 tablets (16 mg total) by mouth at bedtime. 60  tablet 11   No current facility-administered medications for this visit.   No results found.  Review of Systems:   A ROS was performed including pertinent positives and negatives as documented in the HPI.  Physical Exam :   Constitutional: NAD and appears stated age Neurological: Alert and oriented Psych: Appropriate affect and cooperative Last menstrual period 03/01/2016.   Comprehensive Musculoskeletal Exam:      Musculoskeletal Exam  Gait Normal  Alignment Normal   Right Left  Inspection Normal Normal  Palpation    Tenderness None Medial joint line  Crepitus None None  Effusion None Trace  Range of Motion    Extension -3 -3  Flexion 135 135  Strength    Extension 5/5 5/5  Flexion 5/5 5/5  Ligament Exam     Generalized Laxity No No  Lachman Negative Negative   Pivot Shift Negative Negative  Anterior Drawer Negative Negative  Valgus at 0 Negative Negative  Valgus at 20 Negative Negative   Varus at 0 0 0  Varus at 20   0 0  Posterior Drawer at 90 0 0  Vascular/Lymphatic Exam    Edema None None  Venous Stasis Changes No No  Distal Circulation Normal Normal  Neurologic    Light Touch Sensation Intact Intact  Special Tests:      Imaging:   Xray (4 views left knee): Medial joint space narrowing with evidence of previous ACL reconstruction  MRI right knee: Predominantly full-thickness chondral loss involving the medial tibiofemoral joint space although there is some focal cartilage loss involving the lateral tibial femoral condyle  I personally reviewed and interpreted the radiographs.   Assessment:   57 y.o. female with evidence of medial joint compartment osteoarthritis after previous ACL reconstruction.  At this time she denies any instability of the knee.  That being said she is still having persistent medial based knee pain.  I did discuss that I would prefer to avoid any type of steroid injections that she does have a history of an ACL graft.  She has previously had physical therapy for strengthening of the left knee without any relief.  I did discuss her MRI findings.  At today's visit I did recommend an initial treatment with platelet rich plasma as she is hoping to preserve and avoid total knee arthroplasty as long as possible.  Plan :    -Plan to refer her to Dr. Shon Baton for left knee PRP injection     I personally saw and evaluated the patient, and participated in the management and treatment plan.  Huel Cote, MD Attending Physician, Orthopedic Surgery  This document was dictated using Dragon voice recognition software. A reasonable attempt at proof reading has been made to minimize errors.

## 2022-11-24 ENCOUNTER — Encounter: Payer: Self-pay | Admitting: Sports Medicine

## 2022-11-24 ENCOUNTER — Ambulatory Visit (INDEPENDENT_AMBULATORY_CARE_PROVIDER_SITE_OTHER): Payer: BC Managed Care – PPO | Admitting: Sports Medicine

## 2022-11-24 DIAGNOSIS — G8929 Other chronic pain: Secondary | ICD-10-CM

## 2022-11-24 DIAGNOSIS — M25562 Pain in left knee: Secondary | ICD-10-CM

## 2022-11-24 DIAGNOSIS — M1712 Unilateral primary osteoarthritis, left knee: Secondary | ICD-10-CM

## 2022-11-24 NOTE — Progress Notes (Signed)
[name] - 57 y.o. female MRN [id number]  Date of birth: 12-05-1965  Office Visit Note: Visit Date: 11/24/2022 PCP: Gerre Scull, NP Referred by: Huel Cote, MD  Subjective: Chief Complaint  Patient presents with   Left Knee - Pain    PRP discussion   HPI: Lindsay Butler is a pleasant 57 y.o. female who presents today for evaluation of chronic left knee pain and discussion on PRP therapy.  History of medial sided knee pain.  In the past was considering uni-TKA, but now holding off. Seen by my partner, Dr. Steward Drone, discussed possible PRP.  [name] is a very fit and active individual.  She plays pickle ball consistently.  She has a history of 2 ACL surgeries years ago (one done in 104's).  Pain is predominantly over the medial tibiofemoral joint although she does have high-grade cartilage loss tricompartmentally.  She does not take any pharmacologic medication for this.  Does use turmeric.  Pertinent ROS were reviewed with the patient and found to be negative unless otherwise specified above in HPI.   Assessment & Plan: Visit Diagnoses:  1. Unilateral primary osteoarthritis, left knee   2. Chronic pain of left knee    Plan: Impression is knee osteoarthritis with high-grade cartilage loss tricompartmentally although most extensive over the medial tibiofemoral component.  We discussed the overview of PRP injection therapy as well as pre and post procedural protocol.  She would like to move forward with this, we will get this scheduled under ultrasound guidance.  She will hold on any anti-inflammatory medications or supplements 10 days prior to procedure. I did discuss and give her my handout on exercise, physical activity as well as food and supplementation to help with the osteoarthritis.  Would recommend starting PT or home therapy program about 2 weeks from the injection.  Could consider second PRP injection at the 64-month interval if she gets good but only partial relief. She  will schedule PRP at her convenience.  Follow-up: Return for schedule for US-PRP injection left knee (30-min).   Meds & Orders: No orders of the defined types were placed in this encounter.  No orders of the defined types were placed in this encounter.    Procedures: No procedures performed      Clinical History: No specialty comments available.  She reports that she has never smoked. She has never used smokeless tobacco. No results for input(s): "HGBA1C", "LABURIC" in the last 8760 hours.  Objective:   Vital Signs: LMP 03/01/2016 (Exact Date)   Physical Exam  Gen: Well-appearing, in no acute distress; non-toxic CV: Well-perfused. Warm.  Resp: Breathing unlabored on room air; no wheezing. Psych: Fluid speech in conversation; appropriate affect; normal thought process Neuro: Sensation intact throughout. No gross coordination deficits.   Ortho Exam - Left knee: No swelling about the knee joint. Non-antalgic gait.  Imaging:  MR Knee Left  Wo Contrast CLINICAL DATA:  Left knee pain for several years  EXAM: MRI OF THE LEFT KNEE WITHOUT CONTRAST  TECHNIQUE: Multiplanar, multisequence MR imaging of the knee was performed. No intravenous contrast was administered.  COMPARISON:  None Available.  FINDINGS: MENISCI  Medial: Attenuation of the body and posterior horn of the medial meniscus consistent with prior meniscectomy versus chronic tear.  Lateral: Intact  LIGAMENTS  Cruciates: Prior ACL repair with the ACL graft intact.  Intact PCL.  Collaterals: Medial collateral ligament is intact. Lateral collateral ligament complex is intact.  CARTILAGE  Patellofemoral: High-grade partial-thickness cartilage loss  of the trochlear groove extending into the medial and lateral trochlea.  Medial: Extensive full-thickness cartilage loss of the medial femorotibial compartment with subchondral marrow edema.  Lateral: Partial-thickness cartilage loss of the  lateral femorotibial compartment with areas of full-thickness cartilage loss along the lateral aspect of the lateral tibial plateau and subchondral marrow edema.  JOINT: Moderate joint effusion. Normal Hoffa's fat-pad. No plical thickening.  POPLITEAL FOSSA: Popliteus tendon is intact. No Baker's cyst.  EXTENSOR MECHANISM: Intact quadriceps tendon. Intact patellar tendon. Intact lateral patellar retinaculum. Intact medial patellar retinaculum. Intact MPFL.  BONES: No aggressive osseous lesion. No fracture or dislocation.  Other: No fluid collection or hematoma. Muscles are normal.  IMPRESSION: 1. Prior ACL repair with the ACL graft intact. 2. Attenuation of the body and posterior horn of the medial meniscus consistent with prior meniscectomy versus chronic tear. 3. Moderate joint effusion. 4. High-grade partial-thickness cartilage loss of the trochlear groove extending into the medial and lateral trochlea. 5. Extensive full-thickness cartilage loss of the medial femorotibial compartment with subchondral marrow edema. 6. Partial-thickness cartilage loss of the lateral femorotibial compartment with areas of full-thickness cartilage loss along the lateral aspect of the lateral tibial plateau and subchondral marrow edema.  Electronically Signed   By: Elige Ko M.D.   On: 11/16/2022 14:39    Past Medical/Family/Surgical/Social History: Medications & Allergies reviewed per EMR, new medications updated. Patient Active Problem List   Diagnosis Date Noted   Routine general medical examination at a health care facility 09/20/2022   Vaginal dryness, menopausal 02/09/2022   Post-menopausal 07/13/2021   PAT (paroxysmal atrial tachycardia) (HCC) 12/13/2020   Atypical chest pain 12/13/2020   Anemia 08/05/2020   Anxiety 08/05/2020   Asthma 08/05/2020   Heart murmur 08/05/2020   Hypertension 08/05/2020   Restless leg syndrome 08/05/2020   HGSIL on Pap smear of cervix 02/16/2016    Past Medical History:  Diagnosis Date   Anemia    Anxiety    Arthritis 07/16/2020   Mostly in neck and knees   Asthma    Heart murmur    Hypertension    Restless leg syndrome    Family History  Problem Relation Age of Onset   Diabetes Mother    Hypertension Mother    Kidney disease Mother    COPD Father    Past Surgical History:  Procedure Laterality Date   KNEE ARTHROSCOPY W/ ACL RECONSTRUCTION Left    LEEP  1999   x 2   TONSILLECTOMY     Social History   Occupational History   Occupation: client service rep  Tobacco Use   Smoking status: Never   Smokeless tobacco: Never  Vaping Use   Vaping status: Never Used  Substance and Sexual Activity   Alcohol use: Yes    Alcohol/week: 1.0 standard drink of alcohol    Types: 1 Standard drinks or equivalent per week    Comment: occ   Drug use: No   Sexual activity: Yes    Birth control/protection: Post-menopausal, None

## 2022-11-24 NOTE — Patient Instructions (Signed)
Dr. Shon Baton' Guide for Management of Osteoarthritis:  1.)  Keep your body moving - keep working on flexibility and range of motion for the knee.  Good physical activity exercise include: stationary bike, swimming, elliptical.  2.)  Maintain a healthy weight --> 1 pound of body weight = 4 pounds of weight on the knees.  Even small weight loss (5-10+ lbs) can significantly improve pain  3.)  Supplements helpful for arthritis and inflammation: Turmeric (curcumin) Boswellia serrata, collagen hydrolysate, Glucosamine-chondroitin  4.)  Foods helpful for arthritis and inflammation: Fish and foods containing omega-3's, leafy greens (broccoli, spinach), citrus fruits (grapefruit, oranges, lemons), green tea, blueberries, cherries, olive oil (EVO)  *It is very important to stay hydrated, drinking lots of water throughout the day. This helps hydrate structures within the knee.

## 2022-11-24 NOTE — Progress Notes (Signed)
Patient says that she had 2 ACL surgeries years ago and now has swelling in the left knee after activity. She says that she was interested in a partial knee replacement but was advised to try a PRP injection first.

## 2022-12-08 ENCOUNTER — Ambulatory Visit: Payer: BC Managed Care – PPO | Admitting: Sports Medicine

## 2022-12-22 ENCOUNTER — Ambulatory Visit: Payer: BC Managed Care – PPO | Admitting: Sports Medicine

## 2023-01-03 ENCOUNTER — Ambulatory Visit: Payer: BC Managed Care – PPO | Admitting: Sports Medicine

## 2023-01-29 ENCOUNTER — Ambulatory Visit: Payer: BC Managed Care – PPO | Admitting: Sports Medicine

## 2023-03-09 ENCOUNTER — Ambulatory Visit: Payer: BC Managed Care – PPO | Admitting: Sports Medicine

## 2023-07-23 ENCOUNTER — Encounter: Payer: BC Managed Care – PPO | Admitting: Nurse Practitioner

## 2023-10-01 ENCOUNTER — Other Ambulatory Visit: Payer: Self-pay | Admitting: Nurse Practitioner

## 2023-10-03 ENCOUNTER — Other Ambulatory Visit: Payer: Self-pay | Admitting: Family

## 2023-10-03 ENCOUNTER — Telehealth: Payer: Self-pay

## 2023-10-03 MED ORDER — ROPINIROLE HCL ER 8 MG PO TB24: 16.0000 mg | ORAL_TABLET | Freq: Every day | ORAL | 10 refills | Status: DC

## 2023-10-03 NOTE — Telephone Encounter (Signed)
 Requesting: rOPINIRole  (REQUIP  XL) 8 MG 24 hr tablet  Last Visit: 09/20/2022 Next Visit: Visit date not found Last Refill: 09/20/2022  Please Advise    Left message for patient to return call.

## 2023-10-03 NOTE — Telephone Encounter (Unsigned)
 Copied from CRM [id number]. Topic: Clinical - Prescription Issue >> Oct 02, 2023  5:09 PM [place] J wrote: Reason for CRM: Pharmacy sent in a refill request for rOPINIRole  (REQUIP  XL) 8 MG 24 hr tablet. She wanted to stress to her provider that she does not have any medication left and would like an approval as soon as possible. Patient did inform me that she does not have insurance and cannot do an in person office visit for follow up.  Please call patient with some form of update. She is aware of the next day turn around time. >> Oct 03, 2023  4:26 PM Chiquita SQUIBB wrote: Patient is calling in again for [place] but was not able to get her. Patient stated she is completely out of the medication and is asking she could at least get a few pills as she takes two at a time and is worried about not having any. Please advise the patient if this is possible.

## 2023-10-03 NOTE — Telephone Encounter (Signed)
 Copied from CRM [id number]. Topic: General - Other >> Oct 03, 2023  3:37 PM Thersia BROCKS wrote: Reason for CRM: Patient called in regarding a missed call from [place] would like a callback regarding this

## 2023-10-03 NOTE — Telephone Encounter (Signed)
 I called and spoke with patient and notified her that she needs to schedule an appointment. Patient is requesting a few pills till she can make an appointment.

## 2023-10-03 NOTE — Telephone Encounter (Signed)
 Copied from CRM [id number]. Topic: Clinical - Prescription Issue >> Oct 02, 2023  5:09 PM [place] J wrote: Reason for CRM: Pharmacy sent in a refill request for rOPINIRole  (REQUIP  XL) 8 MG 24 hr tablet. She wanted to stress to her provider that she does not have any medication left and would like an approval as soon as possible. Patient did inform me that she does not have insurance and cannot do an in person office visit for follow up.  Please call patient with some form of update. She is aware of the next day turn around time.

## 2023-10-04 ENCOUNTER — Other Ambulatory Visit: Payer: Self-pay | Admitting: Family

## 2023-10-04 MED ORDER — ROPINIROLE HCL ER 8 MG PO TB24: 16.0000 mg | ORAL_TABLET | Freq: Every day | ORAL | 0 refills | Status: DC

## 2023-10-04 NOTE — Telephone Encounter (Signed)
 I called and spoke with patient and notified her of refill and that she will need an appointment. Patient wants to have a virtual visit due to working in [place] and I told her that Tinnie is out of the can let her know Monday if a virtual visit is ok.

## 2023-10-05 ENCOUNTER — Other Ambulatory Visit: Payer: Self-pay | Admitting: Cardiology

## 2023-10-09 ENCOUNTER — Ambulatory Visit: Payer: Self-pay

## 2023-10-09 NOTE — Telephone Encounter (Signed)
 FYI Only or Action Required?: FYI only for provider.  Patient was last seen in primary care on 10/12/2022 by Billy Knee, FNP.  Called Nurse Triage reporting Bronchitis.  Symptoms began 4 days ago.  Interventions attempted: Other: emergent-C .  Symptoms are: gradually worsening.  Triage Disposition: See Physician Within 24 Hours  Patient/caregiver understands and will follow disposition?: yes         Copied from CRM #[id number]. Topic: Clinical - Red Word Triage >> Oct 09, 2023  9:27 AM [name] wrote: [facility] that prompted transfer to Nurse Triage: Patient stated she may have bronchitis due to a constant cough, shakiness, hard to talk, hacking up, and phlegm states it's been getting worse, she doesn't have a car so she can't make a physical appt Reason for Disposition  SEVERE coughing spells (e.g., whooping sound after coughing, vomiting after coughing)  Answer Assessment - Initial Assessment Questions 1. ONSET: When did the cough begin?      4 days ago  2. SEVERITY: How bad is the cough today?      Frequent cough,  3. SPUTUM: Describe the color of your sputum (e.g., none, dry cough; clear, white, yellow, green)     Am: dk yellow rest of day white or clear  4. HEMOPTYSIS: Are you coughing up any blood? If Yes, ask: How much? (e.g., flecks, streaks, tablespoons, etc.)     no 5. DIFFICULTY BREATHING: Are you having difficulty breathing? If Yes, ask: How bad is it? (e.g., mild, moderate, severe)      no 6. FEVER: Do you have a fever? If Yes, ask: What is your temperature, how was it measured, and when did it start?     no 10. OTHER SYMPTOMS: Do you have any other symptoms? (e.g., runny nose, wheezing, chest pain)       Weakness, hoarse, coughing spell  Protocols used: Cough - Acute Productive-A-AH

## 2023-10-10 ENCOUNTER — Telehealth: Payer: Self-pay | Admitting: Nurse Practitioner

## 2023-10-10 ENCOUNTER — Other Ambulatory Visit: Payer: Self-pay | Admitting: Cardiology

## 2023-10-10 ENCOUNTER — Encounter: Payer: Self-pay | Admitting: Nurse Practitioner

## 2023-10-10 DIAGNOSIS — J4521 Mild intermittent asthma with (acute) exacerbation: Secondary | ICD-10-CM

## 2023-10-10 DIAGNOSIS — G2581 Restless legs syndrome: Secondary | ICD-10-CM

## 2023-10-10 MED ORDER — PROPRANOLOL HCL 10 MG PO TABS: 10.0000 mg | ORAL_TABLET | Freq: Every day | ORAL | 0 refills | Status: AC

## 2023-10-10 MED ORDER — ROPINIROLE HCL ER 8 MG PO TB24
16.0000 mg | ORAL_TABLET | Freq: Every day | ORAL | 0 refills | Status: AC
Start: 2023-11-03 — End: ?

## 2023-10-10 MED ORDER — PREDNISONE 10 MG (21) PO TBPK: ORAL_TABLET | ORAL | 0 refills | Status: DC

## 2023-10-10 MED ORDER — DM-GUAIFENESIN ER 30-600 MG PO TB12: 1.0000 | ORAL_TABLET | Freq: Two times a day (BID) | ORAL | Status: DC | PRN

## 2023-10-10 NOTE — Assessment & Plan Note (Signed)
 Stable with use of requip  16mg  daily Use of gabapentin  prn Med refill refill, but advised to schedule appointment with pcp for additional refills.

## 2023-10-10 NOTE — Progress Notes (Signed)
 Virtual Visit via Video Note  I connected withNAME@ on 10/10/23 at 11:20 AM EDT by a video enabled telemedicine application and verified that I am speaking with the correct person using two identifiers.  Location: Patient:Home Provider: Office Participants: patient and provider  I discussed the limitations of evaluation and management by telemedicine and the availability of in person appointments. I also discussed with the patient that there may be a patient responsible charge related to this service. The patient expressed understanding and agreed to proceed.  RR:Rnlhy (productive) worse at night and laying down no fever, Hx of bronchitis, OTC medicatin are not helping  Discuss medication for restless leg  History of Present Illness:  Cough This is a new problem. The current episode started 1 to 4 weeks ago (2weeks). The problem has been unchanged. The problem occurs constantly. The cough is Productive of sputum. Pertinent negatives include no chest pain, chills, ear congestion, ear pain, fever, headaches, heartburn, hemoptysis, myalgias, nasal congestion, postnasal drip, rash, rhinorrhea, sore throat, shortness of breath, sweats, weight loss or wheezing. The symptoms are aggravated by lying down. She has tried nothing for the symptoms. Her past medical history is significant for asthma. There is no history of bronchiectasis, bronchitis, COPD, emphysema, environmental allergies or pneumonia.    Restless leg syndrome Stable with use of requip  16mg  daily Use of gabapentin  prn Med refill refill, but advised to schedule appointment with pcp for additional refills.   Observations/Objective: Physical Exam Nursing note reviewed.  Constitutional:      General: She is not in acute distress.    Appearance: She is not ill-appearing.  Pulmonary:     Effort: Pulmonary effort is normal.  Neurological:     Mental Status: She is oriented to person, place, and time.  Psychiatric:        Mood and  Affect: Mood normal.        Behavior: Behavior normal.        Thought Content: Thought content normal.     Assessment and Plan: Lindsay Butler was seen today for cough.  Diagnoses and all orders for this visit:  Restless leg syndrome -     rOPINIRole  (REQUIP  XL) 8 MG 24 hr tablet; Take 2 tablets (16 mg total) by mouth at bedtime. Schedule and maintain appointment with pcp to get additional refills  Mild intermittent asthma with acute exacerbation -     predniSONE  (STERAPRED UNI-PAK 21 TAB) 10 MG (21) TBPK tablet; As directed on package -     dextromethorphan-guaiFENesin  (MUCINEX  DM) 30-600 MG 12hr tablet; Take 1 tablet by mouth 2 (two) times daily as needed for cough.   Follow Up Instructions: She declined albuterol  refill due to lack of insurance.   I discussed the assessment and treatment plan with the patient. The patient was provided an opportunity to ask questions and all were answered. The patient agreed with the plan and demonstrated an understanding of the instructions.   The patient was advised to call back or seek an in-person evaluation if the symptoms worsen or if the condition fails to improve as anticipated.  Roselie Mood, NP

## 2023-10-10 NOTE — Patient Instructions (Addendum)
 Use albuterol  for cough and weheezing. Use mucinex  DM or Robitussin  or delsym for cough without sinus congestion  You can use plain Tylenol or Advil for fever, chills and achyness. Use cool mist humidifier at bedtime to help with nasal congestion and cough. Cold/cough medications may have tylenol or ibuprofen or guaifenesin  or dextromethophan in them, so be careful not to take beyond the recommended dose for each of these medications.  Schedule appointment with pcp for additional med refills.

## 2023-10-17 ENCOUNTER — Ambulatory Visit: Payer: Self-pay | Admitting: Nurse Practitioner

## 2023-10-17 ENCOUNTER — Encounter: Payer: Self-pay | Admitting: Nurse Practitioner

## 2023-10-17 VITALS — BP 126/70 | HR 89 | Temp 97.7°F | Ht 64.0 in | Wt 126.8 lb

## 2023-10-17 DIAGNOSIS — G2581 Restless legs syndrome: Secondary | ICD-10-CM

## 2023-10-17 DIAGNOSIS — I10 Essential (primary) hypertension: Secondary | ICD-10-CM

## 2023-10-17 DIAGNOSIS — J4520 Mild intermittent asthma, uncomplicated: Secondary | ICD-10-CM

## 2023-10-17 DIAGNOSIS — J029 Acute pharyngitis, unspecified: Secondary | ICD-10-CM

## 2023-10-17 MED ORDER — AMOXICILLIN 500 MG PO CAPS: 500.0000 mg | ORAL_CAPSULE | Freq: Two times a day (BID) | ORAL | 0 refills | Status: AC

## 2023-10-17 NOTE — Progress Notes (Signed)
 Established Patient Office Visit  Subjective   Patient ID: Lindsay Butler, female    DOB: [birth year]  Age: 58 y.o. MRN: [id number]  Chief Complaint  Patient presents with   Sore Throat    Since Monday with swelling-finished Prednisone    HPI:  Discussed the use of AI scribe software for clinical note transcription with the patient, who gave verbal consent to proceed.  History of Present Illness   Lindsay Butler is a 58 year old female who presents with a sore throat and persistent phlegm.  She experiences a sore throat that began after completing a course of prednisone  for a cough and sputum flare-up. The throat feels swollen and similar to past strep throat episodes, with persistent pain even when not swallowing. Phlegm production is significant, and there is some coughing. Prednisone  reduced phlegm, but the sore throat developed after finishing the medication. There is no stuffy or runny nose, fever, ear pain, or significant shortness of breath. She has not been around anyone who is sick. She uses Emergen-C packets and has previously used Zyrtec or Flonase for her symptoms. She is not currently using her inhaler. She takes Requip  regularly and does not require gabapentin  or propranolol  at this time.        ROS See pertinent positives and negatives per HPI.    Objective:     BP 126/70 (BP Location: Left Arm, Patient Position: Sitting, Cuff Size: Normal)   Pulse 89   Temp 97.7 F (36.5 C)   Ht 5' 4 (1.626 m)   Wt 126 lb 12.8 oz (57.5 kg)   LMP 03/01/2016 (Exact Date)   SpO2 98%   BMI 21.77 kg/m    Physical Exam Vitals and nursing note reviewed.  Constitutional:      General: She is not in acute distress.    Appearance: Normal appearance.  HENT:     Head: Normocephalic.     Right Ear: Tympanic membrane, ear canal and external ear normal.     Left Ear: Tympanic membrane, ear canal and external ear normal.     Mouth/Throat:     Mouth: Mucous membranes are moist.      Pharynx: Posterior oropharyngeal erythema present. No oropharyngeal exudate.  Eyes:     Conjunctiva/sclera: Conjunctivae normal.  Cardiovascular:     Rate and Rhythm: Normal rate and regular rhythm.     Pulses: Normal pulses.     Heart sounds: Normal heart sounds.  Pulmonary:     Effort: Pulmonary effort is normal.     Breath sounds: Normal breath sounds.  Musculoskeletal:     Cervical back: Normal range of motion and neck supple. No tenderness.  Lymphadenopathy:     Cervical: No cervical adenopathy.  Skin:    General: Skin is warm.  Neurological:     General: No focal deficit present.     Mental Status: She is alert and oriented to person, place, and time.  Psychiatric:        Mood and Affect: Mood normal.        Behavior: Behavior normal.        Thought Content: Thought content normal.        Judgment: Judgment normal.    The 10-year ASCVD risk score (Arnett DK, et al., 2019) is: 2.9%    Assessment & Plan:   Problem List Items Addressed This Visit       Cardiovascular and Mediastinum   Hypertension   Chronic, stable. Continue propranolol  10mg  daily at  bedtime.         Respiratory   Asthma   Chronic, stable.  She only uses an albuterol  inhaler as needed and this is every few months.  Follow-up if symptoms worsen or any concerns.        Other   Restless leg syndrome   Chronic, stable. Continue Requip  16mg  at bedtime and gabapentin  100mg  daily at bedtime as needed.       Sore throat - Primary   She has a persistent sore throat and phlegm production, with a differential diagnosis including strep throat. Due to symptom persistence and potential strep, antibiotics are necessary. Prescribe amoxicillin 500 mg BID for 10 days. Recommend daily Claritin or Zyrtec for throat drainage and advise adequate hydration. Instruct her to seek emergency care if she experiences breathing difficulty.        Return if symptoms worsen or fail to improve.    Tinnie DELENA Harada,  NP

## 2023-10-17 NOTE — Assessment & Plan Note (Signed)
 Chronic, stable. Continue propranolol  10mg  daily at bedtime.

## 2023-10-17 NOTE — Patient Instructions (Signed)
 It was great to see you!  Start amoxicillin twice a day for 10 days   Start claritin or zyrtec once a day 10mg    Drink plenty of fluids  If you have a hard time breathing, go to the ER  Let's follow-up if your symptoms worsen or don't improve   Take care,  Tinnie Harada, NP

## 2023-10-17 NOTE — Assessment & Plan Note (Signed)
 She has a persistent sore throat and phlegm production, with a differential diagnosis including strep throat. Due to symptom persistence and potential strep, antibiotics are necessary. Prescribe amoxicillin 500 mg BID for 10 days. Recommend daily Claritin or Zyrtec for throat drainage and advise adequate hydration. Instruct her to seek emergency care if she experiences breathing difficulty.

## 2023-10-17 NOTE — Assessment & Plan Note (Addendum)
 Chronic, stable.  She only uses an albuterol  inhaler as needed and this is every few months.  Follow-up if symptoms worsen or any concerns.

## 2023-10-17 NOTE — Assessment & Plan Note (Signed)
 Chronic, stable. Continue Requip  16mg  at bedtime and gabapentin  100mg  daily at bedtime as needed.

## 2023-11-19 ENCOUNTER — Encounter: Payer: Self-pay | Admitting: Radiology

## 2024-02-15 ENCOUNTER — Ambulatory Visit

## 2024-02-15 ENCOUNTER — Encounter: Payer: Self-pay | Admitting: Cardiology

## 2024-02-15 ENCOUNTER — Ambulatory Visit: Payer: Self-pay | Attending: Cardiology | Admitting: Cardiology

## 2024-02-15 VITALS — BP 122/80 | HR 68 | Ht 64.0 in | Wt 129.8 lb

## 2024-02-15 DIAGNOSIS — I4719 Other supraventricular tachycardia: Secondary | ICD-10-CM | POA: Diagnosis not present

## 2024-02-15 DIAGNOSIS — Z79899 Other long term (current) drug therapy: Secondary | ICD-10-CM | POA: Diagnosis not present

## 2024-02-15 DIAGNOSIS — I471 Supraventricular tachycardia, unspecified: Secondary | ICD-10-CM

## 2024-02-15 DIAGNOSIS — Z13220 Encounter for screening for lipoid disorders: Secondary | ICD-10-CM | POA: Diagnosis not present

## 2024-02-15 NOTE — Progress Notes (Unsigned)
 Enrolled for [employer] to mail a ZIO XT long term holter monitor to the patients address on file.

## 2024-02-15 NOTE — Patient Instructions (Signed)
 Medication Instructions:  Your physician recommends that you continue on your current medications as directed. Please refer to the Current Medication list given to you today.  *If you need a refill on your cardiac medications before your next appointment, please call your pharmacy*  Lab Work: CMET, Mag, Lipids, Lp(a) If you have labs (blood work) drawn today and your tests are completely normal, you will receive your results only by: MyChart Message (if you have MyChart) OR A paper copy in the mail If you have any lab test that is abnormal or we need to change your treatment, we will call you to review the results.  Testing/Procedures: Lindsay Butler- Long Term Monitor Instructions  Your physician has requested you wear a ZIO patch monitor for 14 days.  This is a single patch monitor. [employer] supplies one patch monitor per enrollment. Additional stickers are not available. Please do not apply patch if you will be having a Nuclear Stress Test,  Echocardiogram, Cardiac CT, MRI, or Chest Xray during the period you would be wearing the  monitor. The patch cannot be worn during these tests. You cannot remove and re-apply the  ZIO XT patch monitor.  Your ZIO patch monitor will be mailed 3 day USPS to your address on file. It may take 3-5 days  to receive your monitor after you have been enrolled.  Once you have received your monitor, please review the enclosed instructions. Your monitor  has already been registered assigning a specific monitor serial # to you.  Billing and Patient Assistance Program Information  We have supplied [employer] with any of your insurance information on file for billing purposes. [employer] offers a sliding scale Patient Assistance Program for patients that do not have  insurance, or whose insurance does not completely cover the cost of the ZIO monitor.  You must apply for the Patient Assistance Program to qualify for this discounted rate.  To apply, please call [employer] at  [phone], select option 4, select option 2, ask to apply for  Patient Assistance Program. [name] will ask your household income, and how many people  are in your household. They will quote your out-of-pocket cost based on that information.  [employer] will also be able to set up a 81-month, interest-free payment plan if needed.  Applying the monitor   Shave hair from upper left chest.  Hold abrader disc by orange tab. Rub abrader in 40 strokes over the upper left chest as  indicated in your monitor instructions.  Clean area with 4 enclosed alcohol pads. Let dry.  Apply patch as indicated in monitor instructions. Patch will be placed under collarbone on left  side of chest with arrow pointing upward.  Rub patch adhesive wings for 2 minutes. Remove white label marked 1. Remove the white  label marked 2. Rub patch adhesive wings for 2 additional minutes.  While looking in a mirror, press and release button in center of patch. A small green light will  flash 3-4 times. This will be your only indicator that the monitor has been turned on.  Do not shower for the first 24 hours. You may shower after the first 24 hours.  Press the button if you feel a symptom. You will hear a small click. Record Date, Time and  Symptom in the Patient Logbook.  When you are ready to remove the patch, follow instructions on the last 2 pages of Patient  Logbook. Stick patch monitor onto the last page of Patient Logbook.  Place Patient Logbook  in the blue and white box. Use locking tab on box and tape box closed  securely. The blue and white box has prepaid postage on it. Please place it in the mailbox as  soon as possible. Your physician should have your test results approximately 7 days after the  monitor has been mailed back to [facility].  Call [facility] Customer Care at [phone] if you have questions regarding  your ZIO XT patch monitor. Call them immediately if you see an orange light  blinking on your  monitor.  If your monitor falls off in less than 4 days, contact our Monitor department at [phone].  If your monitor becomes loose or falls off after 4 days call [employer] at [phone] for  suggestions on securing your monitor   Follow-Up: At [facility], you and your health needs are our priority.  As part of our continuing mission to provide you with exceptional heart care, our providers are all part of one team.  This team includes your primary Cardiologist (physician) and Advanced Practice Providers or APPs (Physician Assistants and Nurse Practitioners) who all work together to provide you with the care you need, when you need it.  Your next appointment:   1 year(s)  Provider:   Kardie Tobb, DO     Other Instructions:

## 2024-02-15 NOTE — Progress Notes (Signed)
 " Cardiology Office Note:    Date:  02/15/2024   ID:  Lindsay Butler, DOB 21-Feb-1965, MRN [id number]  PCP:  Nedra Tinnie LABOR, NP  Cardiologist:  Dub Huntsman, DO  Electrophysiologist:  None   Referring MD: Nedra Tinnie LABOR, NP    I am ok  History of Present Illness:    Lindsay Butler is a 59 y.o. female with a hx of hypertension, asthma, anxiety, paroxysmal atrial tachycardia propranolol  tells me that she is doing well on this she has not experienced any chest pain since starting this medication.  Since her last visit with me she tells me that she has had some episodes where she feel her heart is not right she cannot quite explain this.  But she is concerned. She also notes that she has been laid off and has been experiencing significant stress in her life.  But there is some good news because she is looking to go back to school and earning a certificate.  Very happy for her.  Past Medical History:  Diagnosis Date   Anemia    Anxiety    Arthritis 07/16/2020   Mostly in neck and knees   Asthma    Heart murmur    Hypertension    Restless leg syndrome     Past Surgical History:  Procedure Laterality Date   KNEE ARTHROSCOPY W/ ACL RECONSTRUCTION Left    LEEP  1999   x 2   TONSILLECTOMY      Current Medications: Current Meds  Medication Sig   albuterol  (VENTOLIN  HFA) 108 (90 Base) MCG/ACT inhaler Inhale 2 puffs into the lungs every 6 (six) hours as needed for wheezing or shortness of breath.   ALPRAZolam (XANAX) 0.25 MG tablet Take 0.25 mg by mouth at bedtime as needed for anxiety.   gabapentin  (NEURONTIN ) 100 MG capsule Take 1 capsule (100 mg total) by mouth at bedtime. (Patient taking differently: Take 100 mg by mouth as needed.)   propranolol  (INDERAL ) 10 MG tablet Take 1 tablet (10 mg total) by mouth at bedtime.   rOPINIRole  (REQUIP  XL) 8 MG 24 hr tablet Take 2 tablets (16 mg total) by mouth at bedtime. Schedule and maintain appointment with pcp to get additional refills      Allergies:   Patient has no known allergies.   Social History   Socioeconomic History   Marital status: Divorced    Spouse name: Not on file   Number of children: 0   Years of education: Not on file   Highest education level: Bachelor's degree (e.g., BA, AB, BS)  Occupational History   Occupation: client service rep  Tobacco Use   Smoking status: Never   Smokeless tobacco: Never  Vaping Use   Vaping status: Never Used  Substance and Sexual Activity   Alcohol use: Yes    Alcohol/week: 1.0 standard drink of alcohol    Types: 1 Standard drinks or equivalent per week    Comment: occ   Drug use: No   Sexual activity: Yes    Birth control/protection: Post-menopausal, None  Other Topics Concern   Not on file  Social History Narrative   Not on file   Social Drivers of Health   Tobacco Use: Low Risk (02/15/2024)   Patient History    Smoking Tobacco Use: Never    Smokeless Tobacco Use: Never    Passive Exposure: Not on file  Financial Resource Strain: High Risk (10/09/2023)   Overall Financial Resource Strain (CARDIA)  Difficulty of Paying Living Expenses: Very hard  Food Insecurity: Food Insecurity Present (10/09/2023)   Epic    Worried About Programme Researcher, Broadcasting/film/video in the Last Year: Often true    Ran Out of Food in the Last Year: Often true  Transportation Needs: No Transportation Needs (10/09/2023)   Epic    Lack of Transportation (Medical): No    Lack of Transportation (Non-Medical): No  Physical Activity: Insufficiently Active (10/09/2023)   Exercise Vital Sign    Days of Exercise per Week: 3 days    Minutes of Exercise per Session: 20 min  Stress: Stress Concern Present (10/09/2023)   [employer] of Occupational Health - Occupational Stress Questionnaire    Feeling of Stress: Very much  Social Connections: Moderately Isolated (10/09/2023)   Social Connection and Isolation Panel    Frequency of Communication with Friends and Family: Never    Frequency of  Social Gatherings with Friends and Family: Never    Attends Religious Services: 1 to 4 times per year    Active Member of Clubs or Organizations: Yes    Attends Banker Meetings: 1 to 4 times per year    Marital Status: Divorced  Depression (PHQ2-9): Medium Risk (10/17/2023)   Depression (PHQ2-9)    PHQ-2 Score: 6  Alcohol Screen: Low Risk (10/09/2023)   Alcohol Screen    Last Alcohol Screening Score (AUDIT): 2  Housing: High Risk (10/09/2023)   Epic    Unable to Pay for Housing in the Last Year: Yes    Number of Times Moved in the Last Year: 1    Homeless in the Last Year: No  Utilities: Not on file  Health Literacy: Not on file     Family History: The patient's family history includes COPD in her father; Diabetes in her mother; Hypertension in her mother; Kidney disease in her mother.  ROS:   Review of Systems  Constitution: Negative for decreased appetite, fever and weight gain.  HENT: Negative for congestion, ear discharge, hoarse voice and sore throat.   Eyes: Negative for discharge, redness, vision loss in right eye and visual halos.  Cardiovascular: Negative for chest pain, dyspnea on exertion, leg swelling, orthopnea and palpitations.  Respiratory: Negative for cough, hemoptysis, shortness of breath and snoring.   Endocrine: Negative for heat intolerance and polyphagia.  Hematologic/Lymphatic: Negative for bleeding problem. Does not bruise/bleed easily.  Skin: Negative for flushing, nail changes, rash and suspicious lesions.  Musculoskeletal: Negative for arthritis, joint pain, muscle cramps, myalgias, neck pain and stiffness.  Gastrointestinal: Negative for abdominal pain, bowel incontinence, diarrhea and excessive appetite.  Genitourinary: Negative for decreased libido, genital sores and incomplete emptying.  Neurological: Negative for brief paralysis, focal weakness, headaches and loss of balance.  Psychiatric/Behavioral: Negative for altered mental status,  depression and suicidal ideas.  Allergic/Immunologic: Negative for HIV exposure and persistent infections.    EKGs/Labs/Other Studies Reviewed:    The following studies were reviewed today:   EKG:  The ekg ordered today demonstrates   Recent Labs: No results found for requested labs within last 365 days.  Recent Lipid Panel    Component Value Date/Time   CHOL 183 07/15/2021 0804   TRIG 88.0 07/15/2021 0804   HDL 62.00 07/15/2021 0804   CHOLHDL 3 07/15/2021 0804   VLDL 17.6 07/15/2021 0804   LDLCALC 104 (H) 07/15/2021 0804    Physical Exam:    VS:  BP 122/80   Pulse 68   Ht 5' 4 (1.626 m)  Wt 129 lb 12.8 oz (58.9 kg)   LMP 03/01/2016   SpO2 97%   BMI 22.28 kg/m     Wt Readings from Last 3 Encounters:  02/15/24 129 lb 12.8 oz (58.9 kg)  10/17/23 126 lb 12.8 oz (57.5 kg)  09/20/22 129 lb 3.2 oz (58.6 kg)     GEN: Well nourished, well developed in no acute distress HEENT: Normal NECK: No JVD; No carotid bruits LYMPHATICS: No lymphadenopathy CARDIAC: S1S2 noted,RRR, no murmurs, rubs, gallops RESPIRATORY:  Clear to auscultation without rales, wheezing or rhonchi  ABDOMEN: Soft, non-tender, non-distended, +bowel sounds, no guarding. EXTREMITIES: No edema, No cyanosis, no clubbing MUSCULOSKELETAL:  No deformity  SKIN: Warm and dry NEUROLOGIC:  Alert and oriented x 3, non-focal PSYCHIATRIC:  Normal affect, good insight  ASSESSMENT:    1. PAT (paroxysmal atrial tachycardia)    PLAN:    With the symptoms she is experiencing I will be good to place a monitor on the patient again and make sure that she is not having worsening SVT.  There is no significant chest pressure and I could be suggestive of angina symptoms we will going to monitor her closely.  Will continue her propranolol  at current dose. She needs screening for hyperlipidemia. Will get CBC and mag   The patient is in agreement with the above plan. The patient left the office in stable condition.  The  patient will follow up in 1 year   Medication Adjustments/Labs and Tests Ordered: Current medicines are reviewed at length with the patient today.  Concerns regarding medicines are outlined above.  Orders Placed This Encounter  Procedures   EKG 12-Lead   No orders of the defined types were placed in this encounter.   There are no Patient Instructions on file for this visit.   Adopting a Healthy Lifestyle.  Know what a healthy weight is for you (roughly BMI <25) and aim to maintain this   Aim for 7+ servings of fruits and vegetables daily   65-80+ fluid ounces of water or unsweet tea for healthy kidneys   Limit to max 1 drink of alcohol per day; avoid smoking/tobacco   Limit animal fats in diet for cholesterol and heart health - choose grass fed whenever available   Avoid highly processed foods, and foods high in saturated/trans fats   Aim for low stress - take time to unwind and care for your mental health   Aim for 150 min of moderate intensity exercise weekly for heart health, and weights twice weekly for bone health   Aim for 7-9 hours of sleep daily   When it comes to diets, agreement about the perfect plan isnt easy to find, even among the experts. Experts at the [facility] of [employer] developed an idea known as the Healthy Eating Plate. Just imagine a plate divided into logical, healthy portions.   The emphasis is on diet quality:   Load up on vegetables and fruits - one-half of your plate: Aim for color and variety, and remember that potatoes dont count.   Go for whole grains - one-quarter of your plate: Whole wheat, barley, wheat berries, quinoa, oats, brown rice, and foods made with them. If you want pasta, go with whole wheat pasta.   Protein power - one-quarter of your plate: Fish, chicken, beans, and nuts are all healthy, versatile protein sources. Limit red meat.   The diet, however, does go beyond the plate, offering a few other suggestions.    Use healthy  plant oils, such as olive, canola, soy, corn, sunflower and peanut. Check the labels, and avoid partially hydrogenated oil, which have unhealthy trans fats.   If youre thirsty, drink water. Coffee and tea are good in moderation, but skip sugary drinks and limit milk and dairy products to one or two daily servings.   The type of carbohydrate in the diet is more important than the amount. Some sources of carbohydrates, such as vegetables, fruits, whole grains, and beans-are healthier than others.   Finally, stay active  Signed, Chares Slaymaker, DO  02/15/2024 8:40 AM    [facility] "

## 2024-02-18 LAB — COMPREHENSIVE METABOLIC PANEL WITH GFR
ALT: 19 [IU]/L (ref 0–32)
AST: 24 [IU]/L (ref 0–40)
Albumin: 4.6 g/dL (ref 3.8–4.9)
Alkaline Phosphatase: 81 [IU]/L (ref 49–135)
BUN/Creatinine Ratio: 20 (ref 9–23)
BUN: 15 mg/dL (ref 6–24)
Bilirubin Total: 1.3 mg/dL — ABNORMAL HIGH (ref 0.0–1.2)
CO2: 25 mmol/L (ref 20–29)
Calcium: 9.7 mg/dL (ref 8.7–10.2)
Chloride: 104 mmol/L (ref 96–106)
Creatinine, Ser: 0.75 mg/dL (ref 0.57–1.00)
Globulin, Total: 2 g/dL (ref 1.5–4.5)
Glucose: 76 mg/dL (ref 70–99)
Potassium: 4.7 mmol/L (ref 3.5–5.2)
Sodium: 142 mmol/L (ref 134–144)
Total Protein: 6.6 g/dL (ref 6.0–8.5)
eGFR: 92 mL/min/{1.73_m2}

## 2024-02-18 LAB — LIPID PANEL
Chol/HDL Ratio: 2.6 ratio (ref 0.0–4.4)
Cholesterol, Total: 222 mg/dL — ABNORMAL HIGH (ref 100–199)
HDL: 85 mg/dL
LDL Chol Calc (NIH): 128 mg/dL — ABNORMAL HIGH (ref 0–99)
Triglycerides: 53 mg/dL (ref 0–149)
VLDL Cholesterol Cal: 9 mg/dL (ref 5–40)

## 2024-02-18 LAB — LIPOPROTEIN A (LPA): Lipoprotein (a): 28.1 nmol/L

## 2024-02-19 ENCOUNTER — Ambulatory Visit: Payer: Self-pay | Admitting: Cardiology

## 2024-02-20 MED ORDER — ATORVASTATIN CALCIUM 10 MG PO TABS: 10.0000 mg | ORAL_TABLET | Freq: Every day | ORAL | 3 refills | Status: AC
